# Patient Record
Sex: Female | Born: 1995 | Hispanic: No | Marital: Single
Health system: Midwestern US, Community
[De-identification: ages and names within clinical notes are randomized; demographics above are authoritative.]

## PROBLEM LIST (undated history)

## (undated) ENCOUNTER — Inpatient Hospital Stay (HOSPITAL_COMMUNITY): Payer: Self-pay

## (undated) DIAGNOSIS — J45909 Unspecified asthma, uncomplicated: Secondary | ICD-10-CM

## (undated) DIAGNOSIS — R55 Syncope and collapse: Secondary | ICD-10-CM

## (undated) DIAGNOSIS — T7840XA Allergy, unspecified, initial encounter: Secondary | ICD-10-CM

## (undated) DIAGNOSIS — J339 Nasal polyp, unspecified: Secondary | ICD-10-CM

## (undated) DIAGNOSIS — J302 Other seasonal allergic rhinitis: Secondary | ICD-10-CM

## (undated) HISTORY — DX: Syncope and collapse: R55

## (undated) HISTORY — DX: Nasal polyp, unspecified: J33.9

## (undated) HISTORY — PX: NASAL POLYP SURGERY: SHX186

## (undated) HISTORY — DX: Allergy, unspecified, initial encounter: T78.40XA

---
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
## 2015-03-11 DIAGNOSIS — J45909 Unspecified asthma, uncomplicated: Secondary | ICD-10-CM | POA: Insufficient documentation

## 2015-09-27 ENCOUNTER — Encounter (INDEPENDENT_AMBULATORY_CARE_PROVIDER_SITE_OTHER): Payer: Self-pay

## 2015-09-27 ENCOUNTER — Ambulatory Visit (INDEPENDENT_AMBULATORY_CARE_PROVIDER_SITE_OTHER): Payer: BLUE CROSS/BLUE SHIELD | Admitting: Medical

## 2015-09-27 ENCOUNTER — Encounter: Payer: Self-pay | Admitting: Medical

## 2015-09-27 VITALS — BP 118/78 | HR 65 | Temp 98.1°F | Ht 69.5 in | Wt 163.0 lb

## 2015-09-27 DIAGNOSIS — J4520 Mild intermittent asthma, uncomplicated: Secondary | ICD-10-CM | POA: Diagnosis not present

## 2015-09-27 DIAGNOSIS — N926 Irregular menstruation, unspecified: Secondary | ICD-10-CM

## 2015-09-27 DIAGNOSIS — R55 Syncope and collapse: Secondary | ICD-10-CM

## 2015-09-27 DIAGNOSIS — J309 Allergic rhinitis, unspecified: Secondary | ICD-10-CM | POA: Diagnosis not present

## 2015-09-27 DIAGNOSIS — N912 Amenorrhea, unspecified: Secondary | ICD-10-CM

## 2015-09-27 LAB — CBC WITH DIFFERENTIAL/PLATELET
BASOS PCT: 0.7 % (ref 0.0–3.0)
Basophils Absolute: 0 10*3/uL (ref 0.0–0.1)
EOS PCT: 9.2 % — AB (ref 0.0–5.0)
Eosinophils Absolute: 0.4 10*3/uL (ref 0.0–0.7)
HCT: 39.5 % (ref 36.0–49.0)
HEMOGLOBIN: 13 g/dL (ref 12.0–16.0)
Lymphocytes Relative: 51.9 % — ABNORMAL HIGH (ref 24.0–48.0)
Lymphs Abs: 2.2 10*3/uL (ref 0.7–4.0)
MCHC: 32.9 g/dL (ref 31.0–37.0)
MCV: 89.5 fl (ref 78.0–98.0)
MONOS PCT: 7 % (ref 3.0–12.0)
Monocytes Absolute: 0.3 10*3/uL (ref 0.1–1.0)
Neutro Abs: 1.3 10*3/uL — ABNORMAL LOW (ref 1.4–7.7)
Neutrophils Relative %: 31.2 % — ABNORMAL LOW (ref 43.0–71.0)
Platelets: 279 10*3/uL (ref 150.0–575.0)
RBC: 4.41 Mil/uL (ref 3.80–5.70)
RDW: 13.5 % (ref 11.4–15.5)
WBC: 4.2 10*3/uL — AB (ref 4.5–13.5)

## 2015-09-27 LAB — COMPREHENSIVE METABOLIC PANEL
ALT: 8 U/L (ref 0–35)
AST: 12 U/L (ref 0–37)
Albumin: 4.4 g/dL (ref 3.5–5.2)
Alkaline Phosphatase: 50 U/L (ref 47–119)
BUN: 11 mg/dL (ref 6–23)
CALCIUM: 9.6 mg/dL (ref 8.4–10.5)
CHLORIDE: 107 meq/L (ref 96–112)
CO2: 28 mEq/L (ref 19–32)
Creatinine, Ser: 0.71 mg/dL (ref 0.40–1.20)
GFR: 135.54 mL/min (ref >60.00)
Glucose, Bld: 83 mg/dL (ref 70–99)
POTASSIUM: 4 meq/L (ref 3.5–5.1)
Sodium: 140 mEq/L (ref 135–145)
TOTAL PROTEIN: 7.5 g/dL (ref 6.0–8.3)
Total Bilirubin: 0.6 mg/dL (ref 0.2–1.2)

## 2015-09-27 LAB — POCT URINALYSIS DIPSTICK
Bilirubin, UA: NEGATIVE
Blood, UA: NEGATIVE
Glucose, UA: NEGATIVE
KETONES UA: NEGATIVE
LEUKOCYTES UA: NEGATIVE
Nitrite, UA: NEGATIVE
Spec Grav, UA: 1.025
UROBILINOGEN UA: 0.2
pH, UA: 6

## 2015-09-27 LAB — POCT URINE PREGNANCY: Preg Test, Ur: NEGATIVE

## 2015-09-27 MED ORDER — BECLOMETHASONE DIPROPIONATE 40 MCG/ACT IN AERS: 2.0000 | INHALATION_SPRAY | Freq: Two times a day (BID) | RESPIRATORY_TRACT | Status: DC

## 2015-09-27 MED ORDER — ALBUTEROL SULFATE HFA 108 (90 BASE) MCG/ACT IN AERS: 2.0000 | INHALATION_SPRAY | Freq: Four times a day (QID) | RESPIRATORY_TRACT | Status: DC | PRN

## 2015-09-27 NOTE — Progress Notes (Signed)
Pre visit review using our clinic review tool, if applicable. No additional management support is needed unless otherwise documented below in the visit note. 

## 2015-09-27 NOTE — Patient Instructions (Signed)
For syncope we did ekg today. Also got cbc and cmp today.  Cause of syncope undetermined but will refer to cardiologist and neurologist since this is second event.  If syncope occurs again you would need ED evaluation at that time to see what is going on at that time.  For asthma will rx qvar inhaler to use daily. Use albuterol as needed per instruction.  Continue allergy meds.  Until seen by specialist recommend trying not drive or operate heavy machinery.  Follow up in 7 days or as needed

## 2015-09-27 NOTE — Progress Notes (Signed)
Subjective:    Patient ID: Stacey Wyatt, female    DOB: 02-Feb-1996, 19 y.o.   MRN: [id number]  HPI   I have reviewed pt PMH, PSH, FH, Social History and Surgical History.  Pt not working now. Monday last day at [employer], no regular exercise, poor diet, eats a lot of sugary foods, single.   Asthma- pt states she has hx of problems. She thinks chronically. But she describes some wheezing one- two times a week and with activity. Sometimes with just brisk walk. Pt states years one severe excacerbation after exposed to smoke when she was 12. Went to ED and treated but not admitted. Pt uses albuterol about twice a week.   Pt does allergies. Summer is worst season but almost year round. Not bad in winter. Pt is on claritin and singulair.  Pt states she passed out at work on Monday. Pt was walking to restroom. Pt states she started to feel dizzy and hot randomly. Pt states she was out for a couple of minutes. This was witnessed. Pt had no shaking or seizure type acitivity reported. Pt felt mild ha afterwards. But no headache before. No chest pain or palptiations before fall. No recent preceding illness. No incontinence at time of syncope. Pt first responder team speculated she had low bs but no fs done and pt states she was eating some things about 10 minutes before this started.   Other time in 2014 she passed out randomly. Pt states back then ems came out and normal blood sugar.  LMP- Sunday first day. Almost finished up per pt. Pt stats she had normal cycle and cycle came when she thought it should.  Miigraine light sensitive and sound sensitive about one time a week. Takes advil and then goes away. This pattern for 2 months. Other day after syncope was not migraine like.            Review of Systems  Constitutional: Negative for fever, chills, diaphoresis, activity change and fatigue.  HENT: Negative for congestion, postnasal drip, sinus pressure and sore throat.     Respiratory: Negative for cough, chest tightness and shortness of breath.   Cardiovascular: Negative for chest pain, palpitations and leg swelling.  Gastrointestinal: Negative for nausea, vomiting and abdominal pain.  Genitourinary: Negative for frequency, flank pain and difficulty urinating.  Musculoskeletal: Negative for neck pain and neck stiffness.  Neurological: Positive for headaches. Negative for dizziness, tremors, seizures, syncope, facial asymmetry, speech difficulty, weakness, light-headedness and numbness.       See hpi dizzy before syncope.   No ha now. Transient  Faint mild after syncope.   Psychiatric/Behavioral: Negative for behavioral problems, confusion and agitation. The patient is not nervous/anxious.     Past Medical History  Diagnosis Date  . Asthma   . Seasonal allergies   . Allergy     Social History   Social History  . Marital Status: Single    Spouse Name: N/A  . Number of Children: N/A  . Years of Education: N/A   Occupational History  . Not on file.   Social History Main Topics  . Smoking status: Never Smoker   . Smokeless tobacco: Not on file  . Alcohol Use: No  . Drug Use: Yes    Special: Marijuana     Comment: states smokes blunt very rare. every other week.  Marland Kitchen. Sexual Activity: Yes    Birth Control/ Protection: None   Other Topics Concern  . Not on file  Social History Narrative    Past Surgical History  Procedure Laterality Date  . Nasal polyp surgery Bilateral     Family History  Problem Relation Age of Onset  . Hypertension Mother   . Allergic rhinitis Father     No Known Allergies  No current outpatient prescriptions on file prior to visit.   No current facility-administered medications on file prior to visit.    BP 118/78 mmHg  Pulse 65  Temp(Src) 98.1 F (36.7 C) (Oral)  Ht 5' 9.5" (1.765 m)  Wt 163 lb (73.936 kg)  BMI 23.73 kg/m2  SpO2 99%       Objective:   Physical Exam  General Mental Status-  Alert. General Appearance- Not in acute distress.   Skin General: Color- Normal Color. Moisture- Normal Moisture.  Neck Carotid Arteries- Normal color. Moisture- Normal Moisture. No carotid bruits. No JVD.  Chest and Lung Exam Auscultation: Breath Sounds:-Normal.  Cardiovascular Auscultation:Rythm- Regular. Murmurs & Other Heart Sounds:Auscultation of the heart reveals- No Murmurs.  Abdomen Inspection:-Inspeection Normal. Palpation/Percussion:Note:No mass. Palpation and Percussion of the abdomen reveal- Non Tender, Non Distended + BS, no rebound or guarding.    Neurologic Cranial Nerve exam:- CN III-XII intact(No nystagmus), symmetric smile. Drift Test:- No drift. .Finger to Nose:- Normal/Intact Strength:- 5/5 equal and symmetric strength both upper and lower extremities.      Assessment & Plan:  For syncope we did ekg today. Also got cbc and cmp today. On ekg leads v leads may indicate rt incomplete bundle branch block.  Cause of syncope undetermined but will refer to cardiologist and neurologist since this is second event.  If syncope occurs again you would need ED evaluation at that time to see what is going on at that time.  For asthma will rx qvar inhaler to use daily. Use albuterol as needed per instruction.  Continue allergy meds.  Until seen by specialist recommend trying not drive or operate heavy machinery.  Follow up in 7 days or as needed

## 2015-10-01 ENCOUNTER — Telehealth: Payer: Self-pay | Admitting: Medical

## 2015-10-01 NOTE — Telephone Encounter (Signed)
Referral have been placed. Advise pt that Victorino DikeJennifer or Ollen GrossMarjorie should eventually call her with those.

## 2015-10-02 NOTE — Telephone Encounter (Signed)
Called patient,states she has been called about both appointments.

## 2015-10-04 ENCOUNTER — Encounter: Payer: Self-pay | Admitting: Medical

## 2015-10-04 ENCOUNTER — Ambulatory Visit (INDEPENDENT_AMBULATORY_CARE_PROVIDER_SITE_OTHER): Payer: BLUE CROSS/BLUE SHIELD | Admitting: Medical

## 2015-10-04 ENCOUNTER — Telehealth: Payer: Self-pay | Admitting: Medical

## 2015-10-04 VITALS — BP 104/70 | HR 61 | Temp 97.1°F | Ht 69.5 in | Wt 160.6 lb

## 2015-10-04 DIAGNOSIS — J4520 Mild intermittent asthma, uncomplicated: Secondary | ICD-10-CM

## 2015-10-04 DIAGNOSIS — R55 Syncope and collapse: Secondary | ICD-10-CM

## 2015-10-04 MED ORDER — BECLOMETHASONE DIPROPIONATE 40 MCG/ACT IN AERS: 2.0000 | INHALATION_SPRAY | Freq: Two times a day (BID) | RESPIRATORY_TRACT | Status: DC

## 2015-10-04 NOTE — Progress Notes (Signed)
Subjective:    Patient ID: Stacey Wyatt, female    DOB: 1995/11/20, 19 y.o.   MRN: [id number]  HPI   Pt in for follow up. She states she feels good today.  Pt states she has not used qvar. Her pharmacy did not have qvar. They told her needed to get from other store. She is still using albuterol twice a day. No wheezing currently.  Pt states no recurrent syncope. But she states the night that she was seen by me when she was cooking that she felt hot and light headed. She thought maybe might pass out again. She sat down and rested 15-20 minutes. Then symptoms subsided. She thinks her heart was beating little fast at that time.  LMP- 2 wks ago.    Review of Systems  Constitutional: Negative for chills and fatigue.  HENT: Negative for congestion, drooling, ear pain, hearing loss, postnasal drip, rhinorrhea, sinus pressure and sneezing.   Respiratory: Negative for cough, chest tightness, shortness of breath and wheezing.   Musculoskeletal: Negative for back pain.  Skin: Negative for pallor.  Neurological: Negative for dizziness, seizures, syncope, weakness, light-headedness, numbness and headaches.  Hematological: Negative for adenopathy. Does not bruise/bleed easily.  Psychiatric/Behavioral: Negative for suicidal ideas, confusion, dysphoric mood and agitation. The patient is not hyperactive.     Past Medical History  Diagnosis Date  . Asthma   . Seasonal allergies   . Allergy     Social History   Social History  . Marital Status: Single    Spouse Name: N/A  . Number of Children: N/A  . Years of Education: N/A   Occupational History  . Not on file.   Social History Main Topics  . Smoking status: Never Smoker   . Smokeless tobacco: Not on file  . Alcohol Use: No  . Drug Use: Yes    Special: Marijuana     Comment: states smokes blunt very rare. every other week.  Marland Kitchen Sexual Activity: Yes    Birth Control/ Protection: None   Other Topics Concern  . Not on file    Social History Narrative    Past Surgical History  Procedure Laterality Date  . Nasal polyp surgery Bilateral     Family History  Problem Relation Age of Onset  . Hypertension Mother   . Allergic rhinitis Father     No Known Allergies  Current Outpatient Prescriptions on File Prior to Visit  Medication Sig Dispense Refill  . albuterol (PROVENTIL HFA;VENTOLIN HFA) 108 (90 BASE) MCG/ACT inhaler Inhale 2 puffs into the lungs every 6 (six) hours as needed for wheezing or shortness of breath. 1 Inhaler 0  . beclomethasone (QVAR) 40 MCG/ACT inhaler Inhale 2 puffs into the lungs 2 (two) times daily. 1 Inhaler 12  . Multiple Vitamins-Minerals (MULTIVITAMIN WITH MINERALS) tablet Take 1 tablet by mouth daily.    . norethindrone-ethinyl estradiol-iron (ESTROSTEP FE,TILIA FE,TRI-LEGEST FE) 1-20/1-30/1-35 MG-MCG tablet Take 1 tablet by mouth daily.     No current facility-administered medications on file prior to visit.    BP 104/70 mmHg  Pulse 61  Temp(Src) 97.1 F (36.2 C) (Oral)  Ht 5' 9.5" (1.765 m)  Wt 160 lb 9.6 oz (72.848 kg)  BMI 23.38 kg/m2  SpO2 100%       Objective:   Physical Exam  General Mental Status- Alert. General Appearance- Not in acute distress.   Skin General: Color- Normal Color. Moisture- Normal Moisture.  Neck Carotid Arteries- Normal color. Moisture- Normal Moisture. No carotid  bruits. No JVD.  Chest and Lung Exam Auscultation: Breath Sounds:-Normal.  Cardiovascular Auscultation:Rythm- Regular. Murmurs & Other Heart Sounds:Auscultation of the heart reveals- No Murmurs.  Abdomen Inspection:-Inspeection Normal. Palpation/Percussion:Note:No mass. Palpation and Percussion of the abdomen reveal- Non Tender, Non Distended + BS, no rebound or guarding.    Neurologic Cranial Nerve exam:- CN III-XII intact(No nystagmus), symmetric smile. Strength:- 5/5 equal and symmetric strength both upper and lower extremities.      Assessment & Plan:   For your asthma, I will send qvar downstairs to our pharmacy. Please use daily so we can assess your response. Continue albuterol if you need as instructed.  For your hx of syncope attend both neurology and cardiology appointment.(next week)  Please remember for any actual syncope then be evaluated same day at ED to evaluate potential cause.  Follow up with me in 2-3 wks.

## 2015-10-04 NOTE — Progress Notes (Signed)
Pre visit review using our clinic review tool, if applicable. No additional management support is needed unless otherwise documented below in the visit note. 

## 2015-10-04 NOTE — Patient Instructions (Signed)
For your asthma, I will send qvar downstairs to our pharmacy. Please use daily so we can assess your response. Continue albuterol if you need as instructed.  For your hx of syncope attend both neurology and cardiology appointment.  Please remember for any actual syncope then be evaluated same day at ED to evaluate potential cause.  Follow up with me in 2-3 wks.

## 2015-10-04 NOTE — Telephone Encounter (Signed)
Pt had near syncope episode 1 wk ago day I saw her. Can cardiology eval be expidited??

## 2015-10-04 NOTE — Telephone Encounter (Signed)
Pt later told me that she will see cardiologist on jan 4th or 6th. So I think that is reasonable. Disregard expidite request.

## 2015-10-08 DIAGNOSIS — T7840XA Allergy, unspecified, initial encounter: Secondary | ICD-10-CM | POA: Insufficient documentation

## 2015-10-08 DIAGNOSIS — J302 Other seasonal allergic rhinitis: Secondary | ICD-10-CM | POA: Insufficient documentation

## 2015-10-08 DIAGNOSIS — R55 Syncope and collapse: Secondary | ICD-10-CM | POA: Insufficient documentation

## 2015-10-08 NOTE — Progress Notes (Signed)
     HPI: 20 yo female for evaluation of syncope. Labs 12/16 showed hgb 13 and normal K. Patient had her first episode of syncope in August 2015 in [place]. She was working at [employer] and was taking an order when she developed sudden feeling of warmth and mild diaphoresis. No chest pain, dyspnea, nausea, palpitations. She then had a syncopal episode. She had another episode approximately one month ago. It is described in similar ways. Approximately one week ago she had an episode of near-syncope while at home again preceded by feelings of hot, mild diaphoresis and no other symptoms. However she does state she was dizzy for approximately 30 minutes. After each episode she has a migraine. She does have dyspnea on exertion which she attributes to asthma. No orthopnea, PND, pedal edema, exertional chest pain or palpitations. She does not have orthostatic symptoms.  Current Outpatient Prescriptions  Medication Sig Dispense Refill  . albuterol (PROVENTIL HFA;VENTOLIN HFA) 108 (90 BASE) MCG/ACT inhaler Inhale 2 puffs into the lungs every 6 (six) hours as needed for wheezing or shortness of breath. 1 Inhaler 0  . beclomethasone (QVAR) 40 MCG/ACT inhaler Inhale 2 puffs into the lungs 2 (two) times daily. 1 Inhaler 3  . Multiple Vitamins-Minerals (MULTIVITAMIN WITH MINERALS) tablet Take 1 tablet by mouth daily.    . norethindrone-ethinyl estradiol-iron (ESTROSTEP FE,TILIA FE,TRI-LEGEST FE) 1-20/1-30/1-35 MG-MCG tablet Take 1 tablet by mouth daily.     No current facility-administered medications for this visit.    No Known Allergies   Past Medical History  Diagnosis Date  . Asthma   . Seasonal allergies   . Allergy   . Syncope     Past Surgical History  Procedure Laterality Date  . Nasal polyp surgery Bilateral     Social History   Social History  . Marital Status: Single    Spouse Name: N/A  . Number of Children: N/A  . Years of Education: N/A   Occupational History  . Not on file.    Social History Main Topics  . Smoking status: Never Smoker   . Smokeless tobacco: Not on file  . Alcohol Use: No  . Drug Use: Yes    Special: Marijuana     Comment: states smokes blunt very rare. every other week.  Marland Kitchen. Sexual Activity: Yes    Birth Control/ Protection: None   Other Topics Concern  . Not on file   Social History Narrative    Family History  Problem Relation Age of Onset  . Hypertension Mother   . Allergic rhinitis Father     ROS: no fevers or chills, productive cough, hemoptysis, dysphasia, odynophagia, melena, hematochezia, dysuria, hematuria, rash, seizure activity, orthopnea, PND, pedal edema, claudication. Remaining systems are negative.  Physical Exam:   Blood pressure 120/72, pulse 70, height 5\' 9"  (1.753 m), weight 162 lb (73.483 kg).  General:  Well developed/well nourished in NAD Skin warm/dry Patient not depressed No peripheral clubbing Back-normal HEENT-normal/normal eyelids Neck supple/normal carotid upstroke bilaterally; no bruits; no JVD; no thyromegaly chest - CTA/ normal expansion CV - RRR/normal S1 and S2; no murmurs, rubs or gallops;  PMI nondisplaced Abdomen -NT/ND, no HSM, no mass, + bowel sounds, no bruit 2+ femoral pulses, no bruits Ext-no edema, chords, 2+ DP Neuro-grossly nonfocal  ECG 09/27/15-NSR, incomplete RBBB  Today's electrocardiogram shows normal sinus rhythm with no ST changes. Normal QT interval.

## 2015-10-09 ENCOUNTER — Encounter: Payer: Self-pay | Admitting: Cardiology

## 2015-10-09 ENCOUNTER — Ambulatory Visit (INDEPENDENT_AMBULATORY_CARE_PROVIDER_SITE_OTHER): Payer: BLUE CROSS/BLUE SHIELD | Admitting: Cardiology

## 2015-10-09 VITALS — BP 120/72 | HR 70 | Ht 69.0 in | Wt 162.0 lb

## 2015-10-09 DIAGNOSIS — R55 Syncope and collapse: Secondary | ICD-10-CM | POA: Diagnosis not present

## 2015-10-09 NOTE — Assessment & Plan Note (Signed)
Continue present medications. 

## 2015-10-09 NOTE — Assessment & Plan Note (Signed)
Etiology unclear but sounds potentially vagally mediated. However she does have migraines afterwards. She is scheduled to see neurology for further evaluation. Schedule echocardiogram to assess LV function. No electrocardiogram is normal. I instructed her to increase her hydration and sodium intake. Hopefully these measures will improve her symptoms. I will see her back in 3 months to reassess. I did explain that she should not drive for 6 months.

## 2015-10-09 NOTE — Patient Instructions (Signed)
Medication Instructions:   NO CHANGE  Testing/Procedures:  Your physician has requested that you have an echocardiogram. Echocardiography is a painless test that uses sound waves to create images of your heart. It provides your doctor with information about the size and shape of your heart and how well your heart's chambers and valves are working. This procedure takes approximately one hour. There are no restrictions for this procedure.    Follow-Up:  Your physician recommends that you schedule a follow-up appointment in: 3 MONTHS WITH DR CRENSHAW   If you need a refill on your cardiac medications before your next appointment, please call your pharmacy.    

## 2015-10-11 ENCOUNTER — Ambulatory Visit (INDEPENDENT_AMBULATORY_CARE_PROVIDER_SITE_OTHER): Payer: BLUE CROSS/BLUE SHIELD | Admitting: Neurology

## 2015-10-11 ENCOUNTER — Encounter: Payer: Self-pay | Admitting: Neurology

## 2015-10-11 VITALS — BP 118/84 | HR 78 | Ht 69.0 in | Wt 160.0 lb

## 2015-10-11 DIAGNOSIS — R55 Syncope and collapse: Secondary | ICD-10-CM

## 2015-10-11 NOTE — Patient Instructions (Signed)
1. Schedule MRI brain with and without contrast 2. Schedule routine EEG 3. As per [place] driving laws, after an episode of loss of consciousness, no driving until 6 months event-free 4. Follow-up in 1 month, call for any changes

## 2015-10-11 NOTE — Progress Notes (Signed)
NEUROLOGY CONSULTATION NOTE  Stacey Wyatt MRN: [id number] DOB: May 18, 1996  Referring provider: Esperanza Richters, PA-C Primary care provider: Esperanza Richters, PA-C  Reason for consult:  Recurrent syncope  Thank you for your kind referral of Stacey Wyatt for consultation of the above symptoms. Although her history is well known to you, please allow me to reiterate it for the purpose of our medical record. Records and images were personally reviewed where available.  HISTORY OF PRESENT ILLNESS: This is a pleasant 20 year old left-handed woman with a history of asthma presenting for 2 episodes of syncope and an episode of near-syncope. She reports the first episode occurred in August 2015 while living in [place], she was working at [employer] when she started feeling hot, her surroundings started spinning and she felt somewhat confused, then she could not keep her balance and fell, losing consciousness for around 5 minutes. No convulsive activity reported. She woke up with a bad headache with frontal pressure, no associated nausea, vomiting, photo/phonophobia. The second episode occurred on 09/23/15, while she was at work, she again had the same hot sensation ("like a hot chill"), diaphoretic, and again with the room spinning. She tried to walk to the restroom but passed out for a few minutes. She again woke up with a bad headache and feeling shaky. The last episode occurred on 09/27/15, she did not pass out but felt the same symptoms with spinning, she sat down and the symptoms went away after 30 minutes. She reports that she was unsure if the first episode was related to PO intake, but she did recall eating prior to the second episode. She denies any sleep deprivation or alcohol use.   She denies any other headaches aside from those that occur after the events (no headaches prior to the episodes). She denies any previous history of migraines, no family history of migraines or similar symptoms. She  denies any olfactory/gustatory hallucinations, deja vu, rising epigastric sensation, focal numbness/tingling/weakness, myoclonic jerks. No diplopia, dysarthria, dysphagia, neck pain, bowel/bladder dysfunction. She has some back pain from her mattress. She denies any staring/unresponsive episodes or gaps in time except for when she passes out. She would feel her heart beating fast. She had a normal birth and early development.  There is no history of febrile convulsions, CNS infections such as meningitis/encephalitis, significant traumatic brain injury, neurosurgical procedures, or family history of seizures.  Laboratory Data:  Lab Results  Component Value Date   WBC 4.2* 09/27/2015   HGB 13.0 09/27/2015   HCT 39.5 09/27/2015   MCV 89.5 09/27/2015   PLT 279.0 09/27/2015     Chemistry      Component Value Date/Time   NA 140 09/27/2015 1122   K 4.0 09/27/2015 1122   CL 107 09/27/2015 1122   CO2 28 09/27/2015 1122   BUN 11 09/27/2015 1122   CREATININE 0.71 09/27/2015 1122      Component Value Date/Time   CALCIUM 9.6 09/27/2015 1122   ALKPHOS 50 09/27/2015 1122   AST 12 09/27/2015 1122   ALT 8 09/27/2015 1122   BILITOT 0.6 09/27/2015 1122      PAST MEDICAL HISTORY: Past Medical History  Diagnosis Date  . Asthma   . Seasonal allergies   . Allergy   . Syncope     PAST SURGICAL HISTORY: Past Surgical History  Procedure Laterality Date  . Nasal polyp surgery Bilateral     MEDICATIONS: Current Outpatient Prescriptions on File Prior to Visit  Medication Sig Dispense Refill  .  albuterol (PROVENTIL HFA;VENTOLIN HFA) 108 (90 BASE) MCG/ACT inhaler Inhale 2 puffs into the lungs every 6 (six) hours as needed for wheezing or shortness of breath. 1 Inhaler 0  . beclomethasone (QVAR) 40 MCG/ACT inhaler Inhale 2 puffs into the lungs 2 (two) times daily. 1 Inhaler 3  . Multiple Vitamins-Minerals (MULTIVITAMIN WITH MINERALS) tablet Take 1 tablet by mouth daily.    . norethindrone-ethinyl  estradiol-iron (ESTROSTEP FE,TILIA FE,TRI-LEGEST FE) 1-20/1-30/1-35 MG-MCG tablet Take 1 tablet by mouth daily.     No current facility-administered medications on file prior to visit.    ALLERGIES: No Known Allergies  FAMILY HISTORY: Family History  Problem Relation Age of Onset  . Hypertension Mother   . Allergic rhinitis Father     SOCIAL HISTORY: Social History   Social History  . Marital Status: Single    Spouse Name: N/A  . Number of Children: N/A  . Years of Education: N/A   Occupational History  . Not on file.   Social History Main Topics  . Smoking status: Never Smoker   . Smokeless tobacco: Not on file  . Alcohol Use: No  . Drug Use: No  . Sexual Activity: Yes    Birth Control/ Protection: None   Other Topics Concern  . Not on file   Social History Narrative    REVIEW OF SYSTEMS: Constitutional: No fevers, chills, or sweats, no generalized fatigue, change in appetite Eyes: No visual changes, double vision, eye pain Ear, nose and throat: No hearing loss, ear pain, nasal congestion, sore throat Cardiovascular: No chest pain, palpitations Respiratory:  No shortness of breath at rest or with exertion, wheezes GastrointestinaI: No nausea, vomiting, diarrhea, abdominal pain, fecal incontinence Genitourinary:  No dysuria, urinary retention or frequency Musculoskeletal:  No neck pain, occasional back pain Integumentary: No rash, pruritus, skin lesions Neurological: as above Psychiatric: No depression, insomnia, anxiety Endocrine: No palpitations, fatigue, diaphoresis, mood swings, change in appetite, change in weight, increased thirst Hematologic/Lymphatic:  No anemia, purpura, petechiae. Allergic/Immunologic: no itchy/runny eyes, nasal congestion, recent allergic reactions, rashes  PHYSICAL EXAM: Filed Vitals:   10/11/15 1247  BP: 118/84  Pulse: 78   General: No acute distress Head:  Normocephalic/atraumatic Eyes: Fundoscopic exam shows bilateral  sharp discs, no vessel changes, exudates, or hemorrhages Neck: supple, no paraspinal tenderness, full range of motion Back: No paraspinal tenderness Heart: regular rate and rhythm Lungs: Clear to auscultation bilaterally. Vascular: No carotid bruits. Skin/Extremities: No rash, no edema Neurological Exam: Mental status: alert and oriented to person, place, and time, no dysarthria or aphasia, Fund of knowledge is appropriate.  Recent and remote memory are intact.  Attention and concentration are normal.    Able to name objects and repeat phrases. Cranial nerves: CN I: not tested CN II: pupils equal, round and reactive to light, visual fields intact, fundi unremarkable. CN III, IV, VI:  full range of motion, no nystagmus, no ptosis CN V: facial sensation intact CN VII: upper and lower face symmetric CN VIII: hearing intact to finger rub CN IX, X: gag intact, uvula midline CN XI: sternocleidomastoid and trapezius muscles intact CN XII: tongue midline Bulk & Tone: normal, no fasciculations. Motor: 5/5 throughout with no pronator drift. Sensation: intact to light touch, cold, pin, vibration and joint position sense.  No extinction to double simultaneous stimulation.  Romberg test negative Deep Tendon Reflexes: +2 throughout, no ankle clonus Plantar responses: downgoing bilaterally Cerebellar: no incoordination on finger to nose, heel to shin. No dysdiadochokinesia Gait: narrow-based and steady,  able to tandem walk adequately. Tremor: none  IMPRESSION: This is a pleasant 20 year old left-handed woman with a history of asthma presenting for 2 episodes of syncope, most recently last 09/20/15. She also reports a near-syncopal episode last month. Symptoms suggestive of vasovagal syncope, agree with cardiac evaluation. From a neurological standpoint, the headaches appear to be a response to the passing out rather than true migraines. MRI brain with and without contrast will be ordered to assess for  underlying structural abnormality. Seizures are considered, but less likely, routine EEG will be done.  driving laws were discussed with the patient, and she knows to stop driving after an episode of loss of consciousness, until 6 months event-free. She will follow-up in 1 month and knows to call for any changes.    Thank you for allowing me to participate in the care of this patient. Please do not hesitate to call for any questions or concerns.   Patrcia Dolly, M.D.  CC: Esperanza Richters, Dr. Jens Som

## 2015-10-16 ENCOUNTER — Ambulatory Visit (HOSPITAL_BASED_OUTPATIENT_CLINIC_OR_DEPARTMENT_OTHER)
Admission: RE | Admit: 2015-10-16 | Discharge: 2015-10-16 | Disposition: A | Discharge status: Home / Self Care | Payer: BLUE CROSS/BLUE SHIELD | Source: Ambulatory Visit | Attending: Cardiology | Admitting: Cardiology

## 2015-10-16 DIAGNOSIS — R55 Syncope and collapse: Secondary | ICD-10-CM | POA: Diagnosis not present

## 2015-10-16 NOTE — Addendum Note (Signed)
Addended by: Evans LanceSTOVER, Kiyoshi Schaab W on: 10/16/2015 02:44 PM   Modules accepted: Orders

## 2015-10-16 NOTE — Progress Notes (Signed)
  Echocardiogram 2D Echocardiogram has been performed.  Stacey Wyatt 10/16/2015, 2:05 PM

## 2015-10-25 ENCOUNTER — Encounter: Payer: BLUE CROSS/BLUE SHIELD | Admitting: Medical

## 2015-10-25 ENCOUNTER — Telehealth: Payer: Self-pay | Admitting: Medical

## 2015-10-25 NOTE — Progress Notes (Signed)
This encounter was created in error - please disregard.

## 2015-10-26 ENCOUNTER — Ambulatory Visit (HOSPITAL_BASED_OUTPATIENT_CLINIC_OR_DEPARTMENT_OTHER): Payer: BLUE CROSS/BLUE SHIELD

## 2015-10-28 ENCOUNTER — Other Ambulatory Visit: Payer: BLUE CROSS/BLUE SHIELD

## 2015-10-28 NOTE — Telephone Encounter (Signed)
No charge. 

## 2015-10-28 NOTE — Telephone Encounter (Signed)
Pt was no show 10/25/15 9:15am for follow up appt, pt has not rescheduled, 1st no show, charge or no charge?

## 2015-11-13 ENCOUNTER — Ambulatory Visit: Payer: BLUE CROSS/BLUE SHIELD | Admitting: Neurology

## 2016-04-15 ENCOUNTER — Emergency Department (HOSPITAL_COMMUNITY)
Admission: EM | Admit: 2016-04-15 | Discharge: 2016-04-15 | Disposition: A | Discharge status: Home / Self Care | Payer: BLUE CROSS/BLUE SHIELD | Attending: Emergency Medicine | Admitting: Emergency Medicine

## 2016-04-15 ENCOUNTER — Encounter (HOSPITAL_COMMUNITY): Payer: Self-pay

## 2016-04-15 DIAGNOSIS — Y999 Unspecified external cause status: Secondary | ICD-10-CM | POA: Diagnosis not present

## 2016-04-15 DIAGNOSIS — J45909 Unspecified asthma, uncomplicated: Secondary | ICD-10-CM | POA: Insufficient documentation

## 2016-04-15 DIAGNOSIS — R51 Headache: Secondary | ICD-10-CM | POA: Insufficient documentation

## 2016-04-15 DIAGNOSIS — Y92410 Unspecified street and highway as the place of occurrence of the external cause: Secondary | ICD-10-CM | POA: Insufficient documentation

## 2016-04-15 DIAGNOSIS — R6884 Jaw pain: Secondary | ICD-10-CM

## 2016-04-15 DIAGNOSIS — R519 Headache, unspecified: Secondary | ICD-10-CM

## 2016-04-15 DIAGNOSIS — Y939 Activity, unspecified: Secondary | ICD-10-CM | POA: Insufficient documentation

## 2016-04-15 MED ORDER — OXYCODONE-ACETAMINOPHEN 5-325 MG PO TABS: 1.0000 | ORAL_TABLET | Freq: Four times a day (QID) | ORAL | Status: DC | PRN

## 2016-04-15 MED ORDER — NAPROXEN 500 MG PO TABS: 500.0000 mg | ORAL_TABLET | Freq: Two times a day (BID) | ORAL | Status: DC

## 2016-04-15 MED ORDER — METHOCARBAMOL 500 MG PO TABS: 500.0000 mg | ORAL_TABLET | Freq: Two times a day (BID) | ORAL | Status: DC

## 2016-04-15 MED ORDER — NAPROXEN 250 MG PO TABS
500.0000 mg | ORAL_TABLET | Freq: Once | ORAL | Status: AC
  Administered 2016-04-15: 500 mg via ORAL
  Filled 2016-04-15: qty 2
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
## 2016-11-04 ENCOUNTER — Inpatient Hospital Stay (HOSPITAL_COMMUNITY)
Admission: AD | Admit: 2016-11-04 | Discharge: 2016-11-05 | Disposition: A | Discharge status: Home / Self Care | Payer: Medicaid Other | Source: Ambulatory Visit | Attending: Obstetrics and Gynecology | Admitting: Obstetrics and Gynecology

## 2016-11-04 ENCOUNTER — Encounter (HOSPITAL_COMMUNITY): Payer: Self-pay | Admitting: *Deleted

## 2016-11-04 DIAGNOSIS — Z3202 Encounter for pregnancy test, result negative: Secondary | ICD-10-CM | POA: Insufficient documentation

## 2016-11-04 DIAGNOSIS — J09X2 Influenza due to identified novel influenza A virus with other respiratory manifestations: Secondary | ICD-10-CM | POA: Diagnosis not present

## 2016-11-04 DIAGNOSIS — J101 Influenza due to other identified influenza virus with other respiratory manifestations: Secondary | ICD-10-CM | POA: Diagnosis not present

## 2016-11-04 DIAGNOSIS — J45909 Unspecified asthma, uncomplicated: Secondary | ICD-10-CM | POA: Diagnosis not present

## 2016-11-04 DIAGNOSIS — J4520 Mild intermittent asthma, uncomplicated: Secondary | ICD-10-CM | POA: Diagnosis not present

## 2016-11-04 DIAGNOSIS — R05 Cough: Secondary | ICD-10-CM | POA: Diagnosis present

## 2016-11-04 LAB — INFLUENZA PANEL BY PCR (TYPE A & B)
Influenza A By PCR: POSITIVE — AB
Influenza B By PCR: NEGATIVE

## 2016-11-04 LAB — URINALYSIS, ROUTINE W REFLEX MICROSCOPIC
Bilirubin Urine: NEGATIVE
Glucose, UA: NEGATIVE mg/dL
HGB URINE DIPSTICK: NEGATIVE
Ketones, ur: 5 mg/dL — AB
Leukocytes, UA: NEGATIVE
NITRITE: NEGATIVE
PH: 6 (ref 5.0–8.0)
Protein, ur: NEGATIVE mg/dL
SPECIFIC GRAVITY, URINE: 1.026 (ref 1.005–1.030)

## 2016-11-04 LAB — POCT PREGNANCY, URINE: Preg Test, Ur: NEGATIVE

## 2016-11-04 NOTE — MAU Note (Signed)
Pt reports cough, body aches, chills, headache.

## 2016-11-04 NOTE — MAU Provider Note (Signed)
Chief Complaint: No chief complaint on file.   First Provider Initiated Contact with Patient 11/04/16 2352      SUBJECTIVE HPI: Stacey Wyatt is a 21 y.o. female who presents [facility] reporting chills, body aches, congestion, cough, HA x ~2 days. Thought it was from mold in her house. No sick contacts. Did not get flu vaccine.   Location: Full body Quality: sore, achy Severity: 10/10 on pain scale Duration: few days Timing: constant Modifying factors: Improved w/ Nyquil Associated signs and symptoms: Pos for chills, cough, congestion.  Past Medical History:  Diagnosis Date  . Allergy   . Asthma   . Seasonal allergies   . Syncope    OB History  No data available   Past Surgical History:  Procedure Laterality Date  . NASAL POLYP SURGERY Bilateral    Social History   Social History  . Marital status: Single    Spouse name: N/A  . Number of children: N/A  . Years of education: N/A   Occupational History  . Not on file.   Social History Main Topics  . Smoking status: Never Smoker  . Smokeless tobacco: Not on file  . Alcohol use No  . Drug use: No  . Sexual activity: Yes    Birth control/ protection: None   Other Topics Concern  . Not on file   Social History Narrative  . No narrative on file   Family History  Problem Relation Age of Onset  . Hypertension Mother   . Allergic rhinitis Father    No current facility-administered medications on file prior to encounter.    Current Outpatient Prescriptions on File Prior to Encounter  Medication Sig Dispense Refill  . albuterol (PROVENTIL HFA;VENTOLIN HFA) 108 (90 BASE) MCG/ACT inhaler Inhale 2 puffs into the lungs every 6 (six) hours as needed for wheezing or shortness of breath. 1 Inhaler 0  . beclomethasone (QVAR) 40 MCG/ACT inhaler Inhale 2 puffs into the lungs 2 (two) times daily. 1 Inhaler 3  . methocarbamol (ROBAXIN) 500 MG tablet Take 1 tablet (500 mg total) by mouth 2 (two) times daily.  20 tablet 0  . Multiple Vitamins-Minerals (MULTIVITAMIN WITH MINERALS) tablet Take 1 tablet by mouth daily.    . naproxen (NAPROSYN) 500 MG tablet Take 1 tablet (500 mg total) by mouth 2 (two) times daily. 30 tablet 0  . norethindrone-ethinyl estradiol-iron (ESTROSTEP FE,TILIA FE,TRI-LEGEST FE) 1-20/1-30/1-35 MG-MCG tablet Take 1 tablet by mouth daily.    Marland Kitchen. oxyCODONE-acetaminophen (PERCOCET/ROXICET) 5-325 MG tablet Take 1 tablet by mouth every 6 (six) hours as needed for severe pain. 6 tablet 0   No Known Allergies  I have reviewed patient's Past Medical Hx, Surgical Hx, Family Hx, Social Hx, medications and allergies.   Review of Systems  Constitutional: Positive for chills and fatigue. Negative for appetite change.       Didn't check temp  HENT: Positive for congestion and rhinorrhea. Negative for sinus pressure and sore throat.   Eyes: Negative for visual disturbance.  Respiratory: Positive for cough. Negative for shortness of breath.   Gastrointestinal: Negative for abdominal pain, diarrhea, nausea and vomiting.  Musculoskeletal: Positive for myalgias. Negative for neck pain and neck stiffness.  Neurological: Positive for headaches.    OBJECTIVE Patient Vitals for the past 24 hrs:  BP Temp Temp src Pulse Resp Height Weight  11/04/16 2205 133/89 99.1 F (37.3 C) Oral 96 20 5\' 10"  (1.778 m) 161 lb (73 kg)   Constitutional: Well-developed, well-nourished female in  mild distress. Ill-appearing.  Cardiovascular: normal rate Respiratory: normal rate and effort. CTAB.  Neurologic: Alert and oriented x 4.  GU: Deferred  LAB RESULTS Results for orders placed or performed during the hospital encounter of 11/04/16 (from the past 24 hour(s))  Urinalysis, Routine w reflex microscopic     Status: Abnormal   Collection Time: 11/04/16 10:10 PM  Result Value Ref Range   Color, Urine YELLOW YELLOW   APPearance HAZY (A) CLEAR   Specific Gravity, Urine 1.026 1.005 - 1.030   pH 6.0 5.0 - 8.0    Glucose, UA NEGATIVE NEGATIVE mg/dL   Hgb urine dipstick NEGATIVE NEGATIVE   Bilirubin Urine NEGATIVE NEGATIVE   Ketones, ur 5 (A) NEGATIVE mg/dL   Protein, ur NEGATIVE NEGATIVE mg/dL   Nitrite NEGATIVE NEGATIVE   Leukocytes, UA NEGATIVE NEGATIVE  Influenza panel by PCR (type A & B)     Status: Abnormal   Collection Time: 11/04/16 10:15 PM  Result Value Ref Range   Influenza A By PCR POSITIVE (A) NEGATIVE   Influenza B By PCR NEGATIVE NEGATIVE  Pregnancy, urine POC     Status: None   Collection Time: 11/04/16 10:20 PM  Result Value Ref Range   Preg Test, Ur NEGATIVE NEGATIVE    IMAGING No results found.  MAU COURSE Orders Placed This Encounter  Procedures  . Influenza panel by PCR (type A & B)  . Urinalysis, Routine w reflex microscopic  . Droplet precaution  . Pregnancy, urine POC    MDM - Influenza A w/oout evidence of complication. Pt has Dx Asthma. Will refill Albuterol and Tx w/ Tamiflu due to increased risk of complications from Flu.   ASSESSMENT 1. Influenza A    PLAN Discharge home in stable condition. Influenza precautions Increase fluids and rest.  Work/school note given Dicussed measures to prevent spread of Flu.   Ibuprofen/Telenol PRN for Body aches. OTC meds for Sx relief.  Follow-up Information    [facility] Follow up.   Why:  for primary care Contact information: [address] [place] [address] [phone]       [facility] EMERGENCY DEPARTMENT Follow up.   Specialty:  Emergency Medicine Why:  as needed if symptoms worsen Contact information: [address] [id number] [place] [address] [phone]          Allergies as of 11/05/2016   No Known Allergies     Medication List    STOP taking these medications   beclomethasone 40 MCG/ACT inhaler Commonly known as:  QVAR   methocarbamol 500 MG tablet Commonly known as:  ROBAXIN   multivitamin with  minerals tablet   naproxen 500 MG tablet Commonly known as:  NAPROSYN   norethindrone-ethinyl estradiol-iron 1-20/1-30/1-35 MG-MCG tablet Commonly known as:  ESTROSTEP FE,TILIA FE,TRI-LEGEST FE   oxyCODONE-acetaminophen 5-325 MG tablet Commonly known as:  PERCOCET/ROXICET     TAKE these medications   albuterol 108 (90 Base) MCG/ACT inhaler Commonly known as:  PROVENTIL HFA;VENTOLIN HFA Inhale 2 puffs into the lungs every 6 (six) hours as needed for wheezing or shortness of breath.   oseltamivir 75 MG capsule Commonly known as:  TAMIFLU Take 1 capsule (75 mg total) by mouth 2 (two) times daily.       Farmer, CNM 11/04/2016  11:50 PM

## 2016-11-05 ENCOUNTER — Encounter (HOSPITAL_COMMUNITY): Payer: Self-pay | Admitting: Advanced Practice Midwife

## 2016-11-05 DIAGNOSIS — J101 Influenza due to other identified influenza virus with other respiratory manifestations: Secondary | ICD-10-CM | POA: Diagnosis not present

## 2016-11-05 DIAGNOSIS — J4520 Mild intermittent asthma, uncomplicated: Secondary | ICD-10-CM | POA: Diagnosis not present

## 2016-11-05 MED ORDER — ALBUTEROL SULFATE HFA 108 (90 BASE) MCG/ACT IN AERS: 2.0000 | INHALATION_SPRAY | Freq: Four times a day (QID) | RESPIRATORY_TRACT | 0 refills | Status: DC | PRN

## 2016-11-05 MED ORDER — OSELTAMIVIR PHOSPHATE 75 MG PO CAPS: 75.0000 mg | ORAL_CAPSULE | Freq: Two times a day (BID) | ORAL | 0 refills | Status: DC

## 2016-11-05 NOTE — Discharge Instructions (Signed)

## 2016-11-25 ENCOUNTER — Inpatient Hospital Stay (HOSPITAL_COMMUNITY): Payer: Medicaid Other

## 2016-11-25 ENCOUNTER — Encounter (HOSPITAL_COMMUNITY): Payer: Self-pay

## 2016-11-25 ENCOUNTER — Inpatient Hospital Stay (HOSPITAL_COMMUNITY)
Admission: AD | Admit: 2016-11-25 | Discharge: 2016-11-25 | Disposition: A | Discharge status: Home / Self Care | Payer: Medicaid Other | Source: Ambulatory Visit | Attending: Obstetrics & Gynecology | Admitting: Obstetrics & Gynecology

## 2016-11-25 DIAGNOSIS — Z3A01 Less than 8 weeks gestation of pregnancy: Secondary | ICD-10-CM | POA: Diagnosis not present

## 2016-11-25 DIAGNOSIS — B9689 Other specified bacterial agents as the cause of diseases classified elsewhere: Secondary | ICD-10-CM | POA: Diagnosis not present

## 2016-11-25 DIAGNOSIS — O23591 Infection of other part of genital tract in pregnancy, first trimester: Secondary | ICD-10-CM | POA: Diagnosis not present

## 2016-11-25 DIAGNOSIS — N760 Acute vaginitis: Secondary | ICD-10-CM | POA: Diagnosis not present

## 2016-11-25 DIAGNOSIS — O209 Hemorrhage in early pregnancy, unspecified: Secondary | ICD-10-CM | POA: Diagnosis not present

## 2016-11-25 LAB — HCG, QUANTITATIVE, PREGNANCY: HCG, BETA CHAIN, QUANT, S: 1263 m[IU]/mL — AB (ref <5)

## 2016-11-25 LAB — POCT PREGNANCY, URINE: Preg Test, Ur: POSITIVE — AB

## 2016-11-25 LAB — WET PREP, GENITAL
Sperm: NONE SEEN
Trich, Wet Prep: NONE SEEN
Yeast Wet Prep HPF POC: NONE SEEN

## 2016-11-25 MED ORDER — METRONIDAZOLE 500 MG PO TABS: 500.0000 mg | ORAL_TABLET | Freq: Two times a day (BID) | ORAL | 0 refills | Status: DC

## 2016-11-25 MED ORDER — PREPLUS 27-1 MG PO TABS: 1.0000 | ORAL_TABLET | Freq: Every day | ORAL | 13 refills | Status: DC

## 2016-11-25 NOTE — MAU Provider Note (Signed)
History     CSN: [id number]  Arrival date and time: 11/25/16 1842   None     Chief Complaint  Patient presents with  . Possible Pregnancy  . Vaginal Bleeding   HPI 21 yo G1P0 presenting today for the evaluation of vaginal bleeding. Patient with LMP 10/20/2016. She reports positive pregnancy test x 3 at home. This was an unplanned pregnancy. Patient reports lower abdominal cramping pain for the past few days and noted bright red blood on her underwear this afternoon and when she wipes. She has not had any further episodes of vaginal bleeding. She describes it as vaginal spotting. She denies any abnormal vaginal discharge. She denies any burning with urination   Past Medical History:  Diagnosis Date  . Allergy   . Asthma   . Seasonal allergies   . Syncope     Past Surgical History:  Procedure Laterality Date  . NASAL POLYP SURGERY Bilateral     Family History  Problem Relation Age of Onset  . Hypertension Mother   . Allergic rhinitis Father     Social History  Substance Use Topics  . Smoking status: Never Smoker  . Smokeless tobacco: Never Used  . Alcohol use No    Allergies: No Known Allergies  Prescriptions Prior to Admission  Medication Sig Dispense Refill Last Dose  . albuterol (PROVENTIL HFA;VENTOLIN HFA) 108 (90 Base) MCG/ACT inhaler Inhale 2 puffs into the lungs every 6 (six) hours as needed for wheezing or shortness of breath. 1 Inhaler 0 11/25/2016 at Unknown time  . oseltamivir (TAMIFLU) 75 MG capsule Take 1 capsule (75 mg total) by mouth 2 (two) times daily. (Patient not taking: Reported on 11/25/2016) 10 capsule 0 Not Taking at Unknown time    Review of Systems  See pertinent in HPI Physical Exam   Blood pressure 116/90, pulse 103, temperature 98.5 F (36.9 C), temperature source Oral, resp. rate 18, height 5\' 10"  (1.778 m), weight 151 lb 3.2 oz (68.6 kg), last menstrual period 10/18/2016.  Physical Exam GENERAL: Well-developed, well-nourished  female in no acute distress.  ABDOMEN: Soft, nontender, nondistended.  PELVIC: Normal external female genitalia. Vagina is pink and rugated.  Normal discharge. Dark blood in vault. Normal appearing cervix. No active bleeding noted.  Uterus is normal in size. No adnexal mass or tenderness. Cervix is closed and long EXTREMITIES: No cyanosis, clubbing, or edema, 2+ distal pulses.  MAU Course  Procedures  MDM Microscopic wet-mount exam shows clue cells.  US Ob Comp Less 14 Wks  Result Date: 11/25/2016 CLINICAL DATA:  Vaginal spotting EXAM: OBSTETRIC <14 WK [place] AND TRANSVAGINAL OB US TECHNIQUE: Both transabdominal and transvaginal ultrasound examinations were performed for complete evaluation of the gestation as well as the maternal uterus, adnexal regions, and pelvic cul-de-sac. Transvaginal technique was performed to assess early pregnancy. COMPARISON:  None. FINDINGS: Intrauterine gestational sac: Single intrauterine gestational sac Yolk sac:  Visualized Embryo:  Not visualized MSD: 3.4  mm   5 w   0  d Subchorionic hemorrhage:  None visualized. Maternal uterus/adnexae: Ovaries are within normal limits. Right ovary measures 2.1 x 2.8 x 1.4 cm. Left ovary measures 2.5 by 3.7 x 1.8 cm. No free fluid. IMPRESSION: Single intrauterine gestational sac with small yolk sac present. Fetal pole not yet identified. No other abnormalities are visualized. Electronically Signed   By: Jasmine Pang M.D.   On: 11/25/2016 19:38   US Ob Transvaginal  Result Date: 11/25/2016 CLINICAL DATA:  Vaginal spotting EXAM: OBSTETRIC <14  WK US AND TRANSVAGINAL OB US TECHNIQUE: Both transabdominal and transvaginal ultrasound examinations were performed for complete evaluation of the gestation as well as the maternal uterus, adnexal regions, and pelvic cul-de-sac. Transvaginal technique was performed to assess early pregnancy. COMPARISON:  None. FINDINGS: Intrauterine gestational sac: Single intrauterine gestational sac Yolk sac:   Visualized Embryo:  Not visualized MSD: 3.4  mm   5 w   0  d Subchorionic hemorrhage:  None visualized. Maternal uterus/adnexae: Ovaries are within normal limits. Right ovary measures 2.1 x 2.8 x 1.4 cm. Left ovary measures 2.5 by 3.7 x 1.8 cm. No free fluid. IMPRESSION: Single intrauterine gestational sac with small yolk sac present. Fetal pole not yet identified. No other abnormalities are visualized. Electronically Signed   By: Jasmine PangKim  Fujinaga M.D.   On: 11/25/2016 19:38     Assessment and Plan  21 yo G1P0 at 3358w3d by LMP first trimester vaginal bleeding and BV - Rx Flagyl provided - Ultrasound results reviewed with the patient - Plan for repeat ultrasound in 10-14 days for viability - Precautions reviewed and reasons to return to MAU explained - List of providers given to start prenatal care - Patient advised to start prenatal vitamins  Stacey Wyatt 11/25/2016, 7:43 PM

## 2016-11-25 NOTE — MAU Note (Signed)
3+ HPTs.  [facility]  Has had some cramping, none now.  Noted spotting about 30min ago, red when wiped.

## 2016-11-26 ENCOUNTER — Telehealth: Payer: Self-pay | Admitting: *Deleted

## 2016-11-26 DIAGNOSIS — O3680X0 Pregnancy with inconclusive fetal viability, not applicable or unspecified: Secondary | ICD-10-CM

## 2016-11-26 LAB — GC/CHLAMYDIA PROBE AMP (~~LOC~~) NOT AT ARMC
Chlamydia: NEGATIVE
Neisseria Gonorrhea: NEGATIVE

## 2016-11-26 NOTE — Telephone Encounter (Signed)
Called pt and informed her that the doctor wants her to have follow up US to check the progress of the pregnancy. I have scheduled the US on 3/5 @ 0900. She will receive the results in our office following the exam.  Pt agreed to plan of care and voiced understanding.

## 2016-12-03 ENCOUNTER — Other Ambulatory Visit: Payer: Self-pay | Admitting: *Deleted

## 2016-12-03 DIAGNOSIS — Z3490 Encounter for supervision of normal pregnancy, unspecified, unspecified trimester: Secondary | ICD-10-CM

## 2016-12-07 ENCOUNTER — Ambulatory Visit (HOSPITAL_COMMUNITY)
Admission: RE | Admit: 2016-12-07 | Discharge: 2016-12-07 | Disposition: A | Discharge status: Home / Self Care | Payer: Medicaid Other | Source: Ambulatory Visit | Attending: Obstetrics and Gynecology | Admitting: Obstetrics and Gynecology

## 2016-12-07 ENCOUNTER — Ambulatory Visit: Payer: Self-pay

## 2016-12-07 DIAGNOSIS — Z3A01 Less than 8 weeks gestation of pregnancy: Secondary | ICD-10-CM | POA: Insufficient documentation

## 2016-12-07 DIAGNOSIS — Z3490 Encounter for supervision of normal pregnancy, unspecified, unspecified trimester: Secondary | ICD-10-CM

## 2016-12-07 DIAGNOSIS — Z3689 Encounter for other specified antenatal screening: Secondary | ICD-10-CM | POA: Insufficient documentation

## 2016-12-07 DIAGNOSIS — O3680X0 Pregnancy with inconclusive fetal viability, not applicable or unspecified: Secondary | ICD-10-CM

## 2016-12-07 NOTE — Progress Notes (Signed)
Pt here today for OB US results.  Notified Dorathy KinsmanVirginia Smith, CNM of results.  Provider recommended that pt have f/u US for viability due to decreased FHR.   US scheduled for March 19th @ 1000.  Pt notified.  Pt stated understanding with no further questions.

## 2016-12-14 ENCOUNTER — Other Ambulatory Visit: Payer: Self-pay | Admitting: General Practice

## 2016-12-14 DIAGNOSIS — Z3490 Encounter for supervision of normal pregnancy, unspecified, unspecified trimester: Secondary | ICD-10-CM

## 2016-12-21 ENCOUNTER — Ambulatory Visit (INDEPENDENT_AMBULATORY_CARE_PROVIDER_SITE_OTHER): Payer: Medicaid Other | Admitting: Family Medicine

## 2016-12-21 ENCOUNTER — Telehealth: Payer: Self-pay | Admitting: Family Medicine

## 2016-12-21 ENCOUNTER — Ambulatory Visit (HOSPITAL_COMMUNITY)
Admission: RE | Admit: 2016-12-21 | Discharge: 2016-12-21 | Disposition: A | Discharge status: Home / Self Care | Payer: Medicaid Other | Source: Ambulatory Visit | Attending: Advanced Practice Midwife | Admitting: Advanced Practice Midwife

## 2016-12-21 ENCOUNTER — Telehealth: Payer: Self-pay | Admitting: *Deleted

## 2016-12-21 ENCOUNTER — Encounter: Payer: Self-pay | Admitting: *Deleted

## 2016-12-21 ENCOUNTER — Encounter: Payer: Self-pay | Admitting: Family Medicine

## 2016-12-21 DIAGNOSIS — O021 Missed abortion: Secondary | ICD-10-CM

## 2016-12-21 DIAGNOSIS — Z3490 Encounter for supervision of normal pregnancy, unspecified, unspecified trimester: Secondary | ICD-10-CM | POA: Diagnosis present

## 2016-12-21 DIAGNOSIS — O468X1 Other antepartum hemorrhage, first trimester: Secondary | ICD-10-CM | POA: Insufficient documentation

## 2016-12-21 DIAGNOSIS — Z3A01 Less than 8 weeks gestation of pregnancy: Secondary | ICD-10-CM | POA: Diagnosis not present

## 2016-12-21 MED ORDER — IBUPROFEN 600 MG PO TABS: 600.0000 mg | ORAL_TABLET | Freq: Four times a day (QID) | ORAL | 1 refills | Status: DC | PRN

## 2016-12-21 MED ORDER — MISOPROSTOL 200 MCG PO TABS: 1000.0000 ug | ORAL_TABLET | Freq: Once | ORAL | 1 refills | Status: DC

## 2016-12-21 NOTE — Telephone Encounter (Signed)
Attempted to reach patient to come in for type and screen today. Unable to reach her. Sent a Clinical cytogeneticistmychart message for patient to come in for labs.

## 2016-12-21 NOTE — Patient Instructions (Addendum)

## 2016-12-21 NOTE — Progress Notes (Signed)
Ultrasounds Results Note  SUBJECTIVE HPI:  Ms. Stacey Wyatt is a 21 y.o. G1P0 at [redacted]w[redacted]d by LMP who presents to the [facility] for followup ultrasound results. The patient denies abdominal pain or vaginal bleeding.  Upon review of the patient's records, patient was first seen in MAU on 11/25/16 for bleeding.  Ultrasound showed yolk sac.   Repeat ultrasound was performed on 12/07/16 and showed fetal bradycardia U/S performed earlier today shows fetal demise.Marland Kitchen   Past Medical History:  Diagnosis Date  . Allergy   . Asthma   . Seasonal allergies   . Syncope    Past Surgical History:  Procedure Laterality Date  . NASAL POLYP SURGERY Bilateral    Social History   Social History  . Marital status: Single    Spouse name: N/A  . Number of children: N/A  . Years of education: N/A   Occupational History  . Not on file.   Social History Main Topics  . Smoking status: Never Smoker  . Smokeless tobacco: Never Used  . Alcohol use No  . Drug use: No  . Sexual activity: Yes    Birth control/ protection: None   Other Topics Concern  . Not on file   Social History Narrative  . No narrative on file   Current Outpatient Prescriptions on File Prior to Visit  Medication Sig Dispense Refill  . albuterol (PROVENTIL HFA;VENTOLIN HFA) 108 (90 Base) MCG/ACT inhaler Inhale 2 puffs into the lungs every 6 (six) hours as needed for wheezing or shortness of breath. 1 Inhaler 0  . metroNIDAZOLE (FLAGYL) 500 MG tablet Take 1 tablet (500 mg total) by mouth 2 (two) times daily. 14 tablet 0  . Prenatal Vit-Fe Fumarate-FA (PREPLUS) 27-1 MG TABS Take 1 tablet by mouth daily. 30 tablet 13   No current facility-administered medications on file prior to visit.    No Known Allergies  I have reviewed patient's Past Medical Hx, Surgical Hx, Family Hx, Social Hx, medications and allergies.   Review of Systems Review of Systems  Constitutional: Negative for fever and chills.  Gastrointestinal:  Negative for nausea, vomiting, abdominal pain, diarrhea and constipation.  Genitourinary: Negative for dysuria.  Musculoskeletal: Negative for back pain.  Neurological: Negative for dizziness and weakness.    Physical Exam  LMP 10/18/2016   GENERAL: Well-developed, well-nourished female in no acute distress.  HEENT: Normocephalic, atraumatic.   LUNGS: Effort normal ABDOMEN: soft, non-tender HEART: Regular rate  SKIN: Warm, dry and without erythema PSYCH: Normal mood and affect NEURO: Alert and oriented x 4  IMAGING US Ob Comp Less 14 Wks  Result Date: 11/25/2016 CLINICAL DATA:  Vaginal spotting EXAM: OBSTETRIC <14 WK [place] AND TRANSVAGINAL OB US TECHNIQUE: Both transabdominal and transvaginal ultrasound examinations were performed for complete evaluation of the gestation as well as the maternal uterus, adnexal regions, and pelvic cul-de-sac. Transvaginal technique was performed to assess early pregnancy. COMPARISON:  None. FINDINGS: Intrauterine gestational sac: Single intrauterine gestational sac Yolk sac:  Visualized Embryo:  Not visualized MSD: 3.4  mm   5 w   0  d Subchorionic hemorrhage:  None visualized. Maternal uterus/adnexae: Ovaries are within normal limits. Right ovary measures 2.1 x 2.8 x 1.4 cm. Left ovary measures 2.5 by 3.7 x 1.8 cm. No free fluid. IMPRESSION: Single intrauterine gestational sac with small yolk sac present. Fetal pole not yet identified. No other abnormalities are visualized. Electronically Signed   By: Jasmine Pang M.D.   On: 11/25/2016 19:38   US  Ob Transvaginal  Result Date: 12/21/2016 CLINICAL DATA:  Early stage pregnancy, followup, assess viability and dating EXAM: TRANSVAGINAL OB ULTRASOUND TECHNIQUE: Transvaginal ultrasound was performed for complete evaluation of the gestation as well as the maternal uterus, adnexal regions, and pelvic cul-de-sac. COMPARISON:  12/07/2016 FINDINGS: Intrauterine gestational sac: Present Yolk sac:  Present Embryo:  Present  Cardiac Activity: Absent Heart Rate: N/A bpm MSD: 21.1  mm   7 w   0 d CRL:   2.9  mm   5 w 5 d Subchorionic hemorrhage: Small subchorionic hemorrhage, 3.2 x 1.2 x 1.5 cm Maternal uterus/adnexae: Uterus otherwise unremarkable. Ovaries normal appearance with a probably corpus luteum in LEFT ovary. No adnexal masses. IMPRESSION: Intrauterine gestational sac containing a fetal pole and yolk sac. Fetal cardiac activity however is no longer identified consistent with fetal demise. Electronically Signed   By: Ulyses Southward M.D.   On: 12/21/2016 10:59   US Ob Transvaginal  Result Date: 12/07/2016 CLINICAL DATA:  Early stage pregnancy EXAM: TRANSVAGINAL OB ULTRASOUND TECHNIQUE: Transvaginal ultrasound was performed for complete evaluation of the gestation as well as the maternal uterus, adnexal regions, and pelvic cul-de-sac. COMPARISON:  None. FINDINGS: Intrauterine gestational sac: Single Yolk sac:  Visualized Embryo:  Visualized Cardiac Activity: Visualized Heart Rate: 95 bpm MSD:   mm    w     d CRL:   4  mm   6 w 0 d                  [place] EDC: 08/02/2017 Subchorionic hemorrhage:  None visualized. Maternal uterus/adnexae: No adnexal masses or free fluid IMPRESSION: Six week intrauterine pregnancy. Fetal heart rate 95 beats per minute. This is lower than expected, but could be related to bradycardia of early pregnancy. This could be followed with repeat ultrasound in 2 weeks to ensure expected progression. Electronically Signed   By: Charlett Nose M.D.   On: 12/07/2016 10:38   US Ob Transvaginal  Result Date: 11/25/2016 CLINICAL DATA:  Vaginal spotting EXAM: OBSTETRIC <14 WK [place] AND TRANSVAGINAL OB US TECHNIQUE: Both transabdominal and transvaginal ultrasound examinations were performed for complete evaluation of the gestation as well as the maternal uterus, adnexal regions, and pelvic cul-de-sac. Transvaginal technique was performed to assess early pregnancy. COMPARISON:  None. FINDINGS: Intrauterine gestational sac:  Single intrauterine gestational sac Yolk sac:  Visualized Embryo:  Not visualized MSD: 3.4  mm   5 w   0  d Subchorionic hemorrhage:  None visualized. Maternal uterus/adnexae: Ovaries are within normal limits. Right ovary measures 2.1 x 2.8 x 1.4 cm. Left ovary measures 2.5 by 3.7 x 1.8 cm. No free fluid. IMPRESSION: Single intrauterine gestational sac with small yolk sac present. Fetal pole not yet identified. No other abnormalities are visualized. Electronically Signed   By: Jasmine Pang M.D.   On: 11/25/2016 19:38    ASSESSMENT Missed AB PLAN Discharge home in stable condition Discussed management of missed abortion: expectant management vs misoprostol vs D&E.  Risks and benefits of all modalities discussed; all questions answered.  Patient opted for misoprostol administration.  Risks and benefits of medical management were carefully explained, including a success rate of 80-90%, the need for another person to be at home with her, and to call/come in if she had heavy bleeding, dizziness, or severe pain not relieved by medication.  Verbal consent was obtained.  Misoprostol 1000 mcg, and Ibuprofen  were all prescribed; written instructions given to patient. She will follow up in one week;  if there has been no passage of pregnancy, will consider D&E.  Bleeding precautions reviewed; she was told to call clinic or come to MAU for any concerns.  Additionally, there is no evidence of type and screen for this patient, will have her come back in for this and given Rhogam if needed.    Reva Boresanya S Collier Monica, MD  12/21/2016  11:35 AM

## 2016-12-21 NOTE — Telephone Encounter (Signed)
Dr.Pratt ask me to call to see if the patient can come back in to give some lab work for her Blood Type, I called the patient three times and no answer and also unable to leave a voice message.

## 2016-12-23 NOTE — Telephone Encounter (Signed)
Called pt and phone rang several times, then disconnected - unable to leave message. Pt needs to come in for ABO+RH and antibody screen labs due to missed Ab.

## 2016-12-24 NOTE — Telephone Encounter (Signed)
Called patient, no answer- phone rings multiple times then disconnects. mychart message has already been sent.

## 2017-01-11 ENCOUNTER — Ambulatory Visit (INDEPENDENT_AMBULATORY_CARE_PROVIDER_SITE_OTHER): Payer: Medicaid Other | Admitting: Family Medicine

## 2017-01-11 ENCOUNTER — Encounter: Payer: Self-pay | Admitting: Family Medicine

## 2017-01-11 VITALS — BP 118/83 | HR 66 | Wt 160.0 lb

## 2017-01-11 DIAGNOSIS — O039 Complete or unspecified spontaneous abortion without complication: Secondary | ICD-10-CM

## 2017-01-11 NOTE — Progress Notes (Signed)
   Subjective:    Patient ID: Stacey Wyatt is a 21 y.o. female presenting with No chief complaint on file.  on 01/11/2017  HPI: Here s/p MAB and treated with cytotec on 12/21/16. Still with small amount of spotting, but wonders if her cycle is coming on. Denies heavy bleeding and denies fever, chills.  Review of Systems  Constitutional: Negative for chills and fever.  Respiratory: Negative for shortness of breath.   Cardiovascular: Negative for chest pain.  Gastrointestinal: Negative for abdominal pain, nausea and vomiting.  Genitourinary: Negative for dysuria.  Skin: Negative for rash.      Objective:    BP 118/83   Pulse 66   Wt 160 lb (72.6 kg)   LMP 10/18/2016   Breastfeeding? Unknown   BMI 22.96 kg/m  Physical Exam  Constitutional: She is oriented to person, place, and time. She appears well-developed and well-nourished. No distress.  HENT:  Head: Normocephalic and atraumatic.  Eyes: No scleral icterus.  Neck: Neck supple.  Cardiovascular: Normal rate.   Pulmonary/Chest: Effort normal.  Abdominal: Soft.  Neurological: She is alert and oriented to person, place, and time.  Skin: Skin is warm and dry.  Psychiatric: She has a normal mood and affect.        Assessment & Plan:  SAB (spontaneous abortion) - complete--warning signs reviewed--declines contraception at this time--will use condoms. - Plan: ABO AND RH , Antibody screen, Antibody screen   Total face-to-face time with patient: 10 minutes. Over 50% of encounter was spent on counseling and coordination of care. Return if symptoms worsen or fail to improve.  Reva Bores 01/11/2017 10:39 AM

## 2017-01-11 NOTE — Patient Instructions (Signed)

## 2017-01-12 LAB — ABO AND RH: Rh Factor: POSITIVE

## 2017-01-12 LAB — ANTIBODY SCREEN: Antibody Screen: NEGATIVE

## 2017-02-18 ENCOUNTER — Inpatient Hospital Stay (HOSPITAL_COMMUNITY)
Admission: AD | Admit: 2017-02-18 | Discharge: 2017-02-18 | Disposition: A | Discharge status: Home / Self Care | Payer: Medicaid Other | Source: Ambulatory Visit | Attending: Obstetrics & Gynecology | Admitting: Obstetrics & Gynecology

## 2017-02-18 ENCOUNTER — Encounter (HOSPITAL_COMMUNITY): Payer: Self-pay

## 2017-02-18 ENCOUNTER — Telehealth: Payer: Self-pay | Admitting: Advanced Practice Midwife

## 2017-02-18 DIAGNOSIS — Z8759 Personal history of other complications of pregnancy, childbirth and the puerperium: Secondary | ICD-10-CM

## 2017-02-18 DIAGNOSIS — Z79899 Other long term (current) drug therapy: Secondary | ICD-10-CM | POA: Insufficient documentation

## 2017-02-18 DIAGNOSIS — R110 Nausea: Secondary | ICD-10-CM | POA: Insufficient documentation

## 2017-02-18 DIAGNOSIS — R109 Unspecified abdominal pain: Secondary | ICD-10-CM

## 2017-02-18 DIAGNOSIS — O039 Complete or unspecified spontaneous abortion without complication: Secondary | ICD-10-CM | POA: Insufficient documentation

## 2017-02-18 LAB — URINALYSIS, ROUTINE W REFLEX MICROSCOPIC
BILIRUBIN URINE: NEGATIVE
GLUCOSE, UA: NEGATIVE mg/dL
KETONES UR: NEGATIVE mg/dL
LEUKOCYTES UA: NEGATIVE
NITRITE: NEGATIVE
PH: 6 (ref 5.0–8.0)
Protein, ur: 30 mg/dL — AB
Specific Gravity, Urine: 1.019 (ref 1.005–1.030)

## 2017-02-18 LAB — CBC
HEMATOCRIT: 38.3 % (ref 36.0–46.0)
Hemoglobin: 13.5 g/dL (ref 12.0–15.0)
MCH: 30.7 pg (ref 26.0–34.0)
MCHC: 35.2 g/dL (ref 30.0–36.0)
MCV: 87 fL (ref 78.0–100.0)
Platelets: 286 10*3/uL (ref 150–400)
RBC: 4.4 MIL/uL (ref 3.87–5.11)
RDW: 12.9 % (ref 11.5–15.5)
WBC: 4.7 10*3/uL (ref 4.0–10.5)

## 2017-02-18 LAB — HCG, QUANTITATIVE, PREGNANCY: hCG, Beta Chain, Quant, S: 4 m[IU]/mL (ref <5)

## 2017-02-18 LAB — POCT PREGNANCY, URINE: Preg Test, Ur: NEGATIVE

## 2017-02-18 MED ORDER — PROMETHAZINE HCL 25 MG PO TABS: 12.5000 mg | ORAL_TABLET | Freq: Four times a day (QID) | ORAL | 0 refills | Status: DC | PRN

## 2017-02-18 NOTE — MAU Note (Signed)
Pt had miscarriage last month. Went to to f/u appointment and never had a f/u BHCG or u/s done a t the last appointment. Pt started having some abd pain and went to the health department and was told she had bacteria "backing up" and [facility] sure her [facility] was completed.

## 2017-02-18 NOTE — Discharge Instructions (Signed)
Abdominal Pain, Adult Abdominal pain can be caused by many things. Often, abdominal pain is not serious and it gets better with no treatment or by being treated at home. However, sometimes abdominal pain is serious. Your health care provider will do a medical history and a physical exam to try to determine the cause of your abdominal pain. Follow these instructions at home:  Take over-the-counter and prescription medicines only as told by your health care provider. Do not take a laxative unless told by your health care provider.  Drink enough fluid to keep your urine clear or pale yellow.  Watch your condition for any changes.  Keep all follow-up visits as told by your health care provider. This is important. Contact a health care provider if:  Your abdominal pain changes or gets worse.  You are not hungry or you lose weight without trying.  You are constipated or have diarrhea for more than 2-3 days.  You have pain when you urinate or have a bowel movement.  Your abdominal pain wakes you up at night.  Your pain gets worse with meals, after eating, or with certain foods.  You are throwing up and cannot keep anything down.  You have a fever. Get help right away if:  Your pain does not go away as soon as your health care provider told you to expect.  You cannot stop throwing up.  Your pain is only in areas of the abdomen, such as the right side or the left lower portion of the abdomen.  You have bloody or black stools, or stools that look like tar.  You have severe pain, cramping, or bloating in your abdomen.  You have signs of dehydration, such as:  Dark urine, very little urine, or no urine.  Cracked lips.  Dry mouth.  Sunken eyes.  Sleepiness.  Weakness. This information is not intended to replace advice given to you by your health care provider. Make sure you discuss any questions you have with your health care provider. Document Released: 07/01/2005 Document  Revised: 04/10/2016 Document Reviewed: 03/04/2016 Elsevier Interactive Patient Education  2017 [employer]vier Inc.  Nausea, Adult Nausea is the feeling of an upset stomach or having to vomit. Nausea on its own is not usually a serious concern, but it may be an early sign of a more serious medical problem. As nausea gets worse, it can lead to vomiting. If vomiting develops, or if you are not able to drink enough fluids, you are at risk of becoming dehydrated. Dehydration can make you tired and thirsty, cause you to have a dry mouth, and decrease how often you urinate. Older adults and people with other diseases or a weak immune system are at higher risk for dehydration. The main goals of treating your nausea are:  To limit repeated nausea episodes.  To prevent vomiting and dehydration. Follow these instructions at home: Follow instructions from your health care provider about how to care for yourself at home. Eating and drinking  Follow these recommendations as told by your health care provider:  Take an oral rehydration solution (ORS). This is a drink that is sold at pharmacies and retail stores.  Drink clear fluids in small amounts as you are able. Clear fluids include water, ice chips, diluted fruit juice, and low-calorie sports drinks.  Eat bland, easy-to-digest foods in small amounts as you are able. These foods include bananas, applesauce, rice, lean meats, toast, and crackers.  Avoid drinking fluids that contain a lot of sugar or caffeine,  such as energy drinks, sports drinks, and soda.  Avoid alcohol.  Avoid spicy or fatty foods. General instructions   Drink enough fluid to keep your urine clear or pale yellow.  Wash your hands often. If soap and water are not available, use hand sanitizer.  Make sure that all people in your household wash their hands well and often.  Rest at home while you recover.  Take over-the-counter and prescription medicines only as told by your health  care provider.  Breathe slowly and deeply when you feel nauseous.  Watch your condition for any changes.  Keep all follow-up visits as told by your health care provider. This is important. Contact a health care provider if:  You have a headache.  You have new symptoms.  Your nausea gets worse.  You have a fever.  You feel light-headed or dizzy.  You vomit.  You cannot keep fluids down. Get help right away if:  You have pain in your chest, neck, arm, or jaw.  You feel extremely weak or you faint.  You have vomit that is bright red or looks like coffee grounds.  You have bloody or black stools or stools that look like tar.  You have a severe headache, a stiff neck, or both.  You have severe pain, cramping, or bloating in your abdomen.  You have a rash.  You have difficulty breathing or are breathing very quickly.  Your heart is beating very quickly.  Your skin feels cold and clammy.  You feel confused.  You have pain when you urinate.  You have signs of dehydration, such as:  Dark urine, very little, or no urine.  Cracked lips.  Dry mouth.  Sunken eyes.  Sleepiness.  Weakness. These symptoms may represent a serious problem that is an emergency. Do not wait to see if the symptoms will go away. Get medical help right away. Call your local emergency services (911 in the [place] Do not drive yourself to the hospital. This information is not intended to replace advice given to you by your health care provider. Make sure you discuss any questions you have with your health care provider. Document Released: 10/29/2004 Document Revised: 02/24/2016 Document Reviewed: 05/28/2015 Elsevier Interactive Patient Education  2017 [employer].

## 2017-02-18 NOTE — MAU Provider Note (Signed)
Chief Complaint: Abdominal Pain   First Provider Initiated Contact with Patient 02/18/17 1220      SUBJECTIVE HPI: Stacey Wyatt is a 21 y.o. G1P0010 who presents to maternity admissions reporting abdominal cramping and nausea x 1 week. She reports her stomach is growling, not really painful, but her stomach is cramping mildly, like with hunger pains. It is associated with nausea that occurs mostly in the mornings and resolves spontaneously.  She went to the health department with her symptoms and had pelvic exam with STD testing, which was negative but was prescribed Flagyl to treat bacterial vaginosis. [facility] and given Cytotec Rx on 12/21/16 and had follow up visit in office on 01/11/17.  She did not have any labwork drawn so at her health department visit she was told she should follow up in case the miscarriage was not completed.  She denies vaginal bleeding, vaginal itching/burning, urinary symptoms, h/a, dizziness, n/v, or fever/chills.     HPI  Past Medical History:  Diagnosis Date  . Allergy   . Asthma   . Seasonal allergies   . Syncope    Past Surgical History:  Procedure Laterality Date  . NASAL POLYP SURGERY Bilateral    Social History   Social History  . Marital status: Single    Spouse name: N/A  . Number of children: N/A  . Years of education: N/A   Occupational History  . Not on file.   Social History Main Topics  . Smoking status: Never Smoker  . Smokeless tobacco: Never Used  . Alcohol use No  . Drug use: No  . Sexual activity: Yes    Birth control/ protection: None   Other Topics Concern  . Not on file   Social History Narrative  . No narrative on file   No current facility-administered medications on file prior to encounter.    Current Outpatient Prescriptions on File Prior to Encounter  Medication Sig Dispense Refill  . albuterol (PROVENTIL HFA;VENTOLIN HFA) 108 (90 Base) MCG/ACT inhaler Inhale 2 puffs into  the lungs every 6 (six) hours as needed for wheezing or shortness of breath. 1 Inhaler 0  . ibuprofen (ADVIL,MOTRIN) 600 MG tablet Take 1 tablet (600 mg total) by mouth every 6 (six) hours as needed. 30 tablet 1  . Prenatal Vit-Fe Fumarate-FA (PREPLUS) 27-1 MG TABS Take 1 tablet by mouth daily. 30 tablet 13   No Known Allergies  ROS:  Review of Systems  Constitutional: Negative for chills, fatigue and fever.  Respiratory: Negative for shortness of breath.   Cardiovascular: Negative for chest pain.  Gastrointestinal: Positive for abdominal pain and nausea.  Genitourinary: Negative for difficulty urinating, dysuria, flank pain, pelvic pain, vaginal bleeding, vaginal discharge and vaginal pain.  Neurological: Negative for dizziness and headaches.  Psychiatric/Behavioral: Negative.      I have reviewed patient's Past Medical Hx, Surgical Hx, Family Hx, Social Hx, medications and allergies.   Physical Exam   Patient Vitals for the past 24 hrs:  BP Temp Temp src Pulse Resp Height Weight  02/18/17 1258 (!) 137/96 - - 84 16 - -  02/18/17 1130 (!) 139/100 - - 89 - - -  02/18/17 1116 (!) 148/101 98.3 F (36.8 C) Oral 84 18 5\' 11"  (1.803 m) 165 lb (74.8 kg)   Constitutional: Well-developed, well-nourished female in no acute distress.  Cardiovascular: normal rate Respiratory: normal effort GI: Abd soft, non-tender. Pos BS x 4 MS: Extremities nontender, no edema,  normal ROM Neurologic: Alert and oriented x 4.  GU: Neg CVAT.  PELVIC EXAM: Deferred   LAB RESULTS Results for orders placed or performed during the hospital encounter of 02/18/17 (from the past 24 hour(s))  Urinalysis, Routine w reflex microscopic     Status: Abnormal   Collection Time: 02/18/17 11:10 AM  Result Value Ref Range   Color, Urine YELLOW YELLOW   APPearance CLEAR CLEAR   Specific Gravity, Urine 1.019 1.005 - 1.030   pH 6.0 5.0 - 8.0   Glucose, UA NEGATIVE NEGATIVE mg/dL   Hgb urine dipstick MODERATE (A)  NEGATIVE   Bilirubin Urine NEGATIVE NEGATIVE   Ketones, ur NEGATIVE NEGATIVE mg/dL   Protein, ur 30 (A) NEGATIVE mg/dL   Nitrite NEGATIVE NEGATIVE   Leukocytes, UA NEGATIVE NEGATIVE   RBC / HPF 0-5 0 - 5 RBC/hpf   WBC, UA 0-5 0 - 5 WBC/hpf   Bacteria, UA RARE (A) NONE SEEN   Squamous Epithelial / LPF 0-5 (A) NONE SEEN   Mucous PRESENT   Pregnancy, urine POC     Status: None   Collection Time: 02/18/17 11:38 AM  Result Value Ref Range   Preg Test, Ur NEGATIVE NEGATIVE  CBC     Status: None   Collection Time: 02/18/17 12:35 PM  Result Value Ref Range   WBC 4.7 4.0 - 10.5 K/uL   RBC 4.40 3.87 - 5.11 MIL/uL   Hemoglobin 13.5 12.0 - 15.0 g/dL   HCT 40.9 81.1 - 91.4 %   MCV 87.0 78.0 - 100.0 fL   MCH 30.7 26.0 - 34.0 pg   MCHC 35.2 30.0 - 36.0 g/dL   RDW 78.2 95.6 - 21.3 %   Platelets 286 150 - 400 K/uL  hCG, quantitative, pregnancy     Status: None   Collection Time: 02/18/17 12:35 PM  Result Value Ref Range   hCG, Beta Chain, Quant, S 4 <5 mIU/mL    B/Positive/-- (04/09 1046)  IMAGING No results found.  MAU Management/MDM: Ordered labs and reviewed results.  Unlikely complication following SAB because pain is minimal and there is no bleeding.  Pt had normal follow up visit in [facility] office after SAB.  Quant hcg result pending at time of patient discharge today.  No evidence of infection, with pain minimal.  Pelvic exam deferred because results wnl at [facility] recently.  Will treat pt for nausea with Phenergan 12.5-25 mg PO Q 6 hours PRN sent to pharmacy. Will call pt today regarding quant hcg result and discuss F/U.   Pt stable at time of discharge.  Addendum:  Called pt regarding quant hcg result of 4 and left message.  Pt returned call immediately afterwards.  Result discussed with pt.  Miscarriage complete with no evidence of retained products or infection.  No recommended follow up.  Continue Phenergan PRN as prescribed and follow up in office for routine care, in MAU for  emergencies.   ASSESSMENT 1. Nausea   2. Abdominal cramping   3. Hx of one miscarriage     PLAN Discharge home      Allergies as of 02/18/2017   No Known Allergies     Medication List    TAKE these medications   albuterol 108 (90 Base) MCG/ACT inhaler Commonly known as:  PROVENTIL HFA;VENTOLIN HFA Inhale 2 puffs into the lungs every 6 (six) hours as needed for wheezing or shortness of breath.   ibuprofen 600 MG tablet Commonly known as:  ADVIL,MOTRIN Take 1 tablet (  600 mg total) by mouth every 6 (six) hours as needed.   PREPLUS 27-1 MG Tabs Take 1 tablet by mouth daily.   promethazine 25 MG tablet Commonly known as:  PHENERGAN Take 0.5-1 tablets (12.5-25 mg total) by mouth every 6 (six) hours as needed.      Follow-up Information    [facility] Follow up.   Specialty:  Obstetrics and Gynecology Why:  The provider from MAU will call you with lab results today or call [phone].  For routine Gyn care/Pap. Return to MAU as needed for emergencies. Contact information: [address] [place] [address] [phone]          Sharen Counter Certified Nurse-Midwife 02/18/2017  5:52 PM

## 2017-02-18 NOTE — Telephone Encounter (Signed)
Called pt to notify her of negative pregnancy with quant hcg of 4 today.  Left message for pt to return call regarding lab results.

## 2017-04-27 ENCOUNTER — Emergency Department (HOSPITAL_BASED_OUTPATIENT_CLINIC_OR_DEPARTMENT_OTHER)
Admission: EM | Admit: 2017-04-27 | Discharge: 2017-04-27 | Disposition: A | Discharge status: Home / Self Care | Payer: Managed Care, Other (non HMO) | Attending: Emergency Medicine | Admitting: Emergency Medicine

## 2017-04-27 ENCOUNTER — Encounter (HOSPITAL_BASED_OUTPATIENT_CLINIC_OR_DEPARTMENT_OTHER): Payer: Self-pay | Admitting: *Deleted

## 2017-04-27 DIAGNOSIS — R4020 Unspecified coma: Secondary | ICD-10-CM

## 2017-04-27 DIAGNOSIS — G4489 Other headache syndrome: Secondary | ICD-10-CM

## 2017-04-27 DIAGNOSIS — J45909 Unspecified asthma, uncomplicated: Secondary | ICD-10-CM | POA: Insufficient documentation

## 2017-04-27 DIAGNOSIS — R55 Syncope and collapse: Secondary | ICD-10-CM | POA: Insufficient documentation

## 2017-04-27 DIAGNOSIS — R51 Headache: Secondary | ICD-10-CM | POA: Diagnosis not present

## 2017-04-27 LAB — BASIC METABOLIC PANEL WITH GFR
Anion gap: 9 (ref 5–15)
BUN: 10 mg/dL (ref 6–20)
CO2: 27 mmol/L (ref 22–32)
Calcium: 9.2 mg/dL (ref 8.9–10.3)
Chloride: 102 mmol/L (ref 101–111)
Creatinine, Ser: 0.78 mg/dL (ref 0.44–1.00)
GFR calc Af Amer: >60 mL/min
GFR calc non Af Amer: >60 mL/min
Glucose, Bld: 84 mg/dL (ref 65–99)
Potassium: 3.5 mmol/L (ref 3.5–5.1)
Sodium: 138 mmol/L (ref 135–145)

## 2017-04-27 LAB — PREGNANCY, URINE: Preg Test, Ur: NEGATIVE

## 2017-04-27 MED ORDER — KETOROLAC TROMETHAMINE 30 MG/ML IJ SOLN
30.0000 mg | Freq: Once | INTRAMUSCULAR | Status: AC
  Administered 2017-04-27: 30 mg via INTRAMUSCULAR
  Filled 2017-04-27: qty 1

## 2017-04-27 NOTE — Discharge Instructions (Signed)
You were seen today for loss of consciousness and headache. You had a partial workup less than 2 years ago to include an echo and EKG. You need to follow-up closely with her primary physician to have repeat evaluation. You had been scheduled for an MRI and an EEG. This is likely reasonable for further evaluation given her persistent episodes of loss of consciousness. If you develop pain or worsening symptoms she should be reevaluated.

## 2017-04-27 NOTE — ED Provider Notes (Signed)
[facility] Provider Note   CSN: [id number] Arrival date & time: 04/27/17  0350     History   Chief Complaint Chief Complaint  Patient presents with  . Headache    HPI Stacey Wyatt is a 21 y.o. female.  HPI  This is a 21 year old female who presents with loss of consciousness and headache. Patient reports she had an episode of loss of consciousness on Saturday night. This was witnessed by friend. Her friend was concerned that she had a seizure. He reported that her eyes rolled in the back of her head. She did not seek medical treatment. She did have a short prodrome. She does have a history of syncope/loss of consciousness episodes and reports that she normally feels dizziness prior to loss of consciousness. She had a partial workup to include an EKG and an echocardiogram. She had been scheduled for an EEG and MRI by neurology; however, due to insurance issues she was unable to get these tests. She was lost to follow-up. Patient states that since having this episode on Saturday night she has had a frontal headache. She states that she gets frontal headaches after these episodes routinely. This one has been persistent. She took ibuprofen with minimal relief. She reports photophobia. She denies any weakness, numbness, tingling. She denies any vision changes or speech disturbance. Current headache is 5 out of 10. Her mother is concerned as the patient lives by herself and continues to have these episodes. Patient denies any fevers, neck stiffness, worst headache of her life.  Patient's chart reviewed. She saw Dr. Jens Som in January of 2017. At that time her EKG showed no evidence of arrhythmia. Echo was reassuring. No hypertrophy of the ventricular wall. She subsequently saw Dr. Karel Jarvis (neurology) who recommended EEG and MRI with and without contrast. Patient did not obtain these tests. She was lost to follow-up.  Past Medical History:  Diagnosis Date  . Allergy   . Asthma     . Seasonal allergies   . Syncope     Patient Active Problem List   Diagnosis Date Noted  . Faintness 10/11/2015  . Asthma   . Seasonal allergies   . Allergy   . Syncope     Past Surgical History:  Procedure Laterality Date  . NASAL POLYP SURGERY Bilateral     OB History    Gravida Para Term Preterm AB Living   1       1     SAB TAB Ectopic Multiple Live Births   1               Home Medications    Prior to Admission medications   Medication Sig Start Date End Date Taking? Authorizing Provider  albuterol (PROVENTIL HFA;VENTOLIN HFA) 108 (90 Base) MCG/ACT inhaler Inhale 2 puffs into the lungs every 6 (six) hours as needed for wheezing or shortness of breath. 11/05/16   Dorathy Kinsman, CNM    Family History Family History  Problem Relation Age of Onset  . Hypertension Mother   . Allergic rhinitis Father     Social History Social History  Substance Use Topics  . Smoking status: Never Smoker  . Smokeless tobacco: Never Used  . Alcohol use No     Allergies   Patient has no known allergies.   Review of Systems Review of Systems  Constitutional: Negative for fever.  Eyes: Positive for photophobia.  Respiratory: Negative for shortness of breath.   Cardiovascular: Negative for chest pain.  Gastrointestinal: Negative  for abdominal pain, nausea and vomiting.  Neurological: Positive for dizziness and headaches. Negative for speech difficulty and weakness.  All other systems reviewed and are negative.    Physical Exam Updated Vital Signs BP (!) 144/103 (BP Location: Right Arm)   Pulse 89   Temp 98.7 F (37.1 C) (Oral)   Resp (!) 22   LMP 04/19/2017   SpO2 99%   Physical Exam  Constitutional: She is oriented to person, place, and time. She appears well-developed and well-nourished. No distress.  HENT:  Head: Normocephalic and atraumatic.  Eyes: Pupils are equal, round, and reactive to light. EOM are normal.  Neck: Normal range of motion. Neck supple.   Cardiovascular: Normal rate, regular rhythm and normal heart sounds.   No murmur heard. Pulmonary/Chest: Effort normal and breath sounds normal. No respiratory distress. She has no wheezes.  Neurological: She is alert and oriented to person, place, and time.  Cranial nerves II through XII intact, 5 out of 5 strength in all 4 extremities, no dysmetria to finger-nose-finger  Skin: Skin is warm and dry.  Psychiatric: She has a normal mood and affect.  Nursing note and vitals reviewed.    ED Treatments / Results  Labs (all labs ordered are listed, but only abnormal results are displayed) Labs Reviewed  BASIC METABOLIC PANEL  PREGNANCY, URINE    EKG  EKG Interpretation  Date/Time:  Tuesday April 27 2017 04:55:27 EDT Ventricular Rate:  70 PR Interval:    QRS Duration: 91 QT Interval:  413 QTC Calculation: 446 R Axis:   13 Text Interpretation:  Sinus rhythm Confirmed by Ross Marcus ([id number]) on 04/27/2017 5:24:41 AM       Radiology No results found.  Procedures Procedures (including critical care time)  Medications Ordered in ED Medications  ketorolac (TORADOL) 30 MG/ML injection 30 mg (30 mg Intramuscular Given 04/27/17 0437)     Initial Impression / Assessment and Plan / ED Course  I have reviewed the triage vital signs and the nursing notes.  Pertinent labs & imaging results that were available during my care of the patient were reviewed by me and considered in my medical decision making (see chart for details).     Patient presents with headache following an episode of loss of consciousness on Saturday. She is nontoxic on exam. Vital signs reassuring. She is nonfocal on neurologic exam. She has a history of similar episodes in the past and has been evaluated by both cardiology and neurology; however, her neurology testing was not completed. Considerations include and could be, seizure. No evidence of arrhythmia on EKG. No metabolic disturbances on BMP. Patient  improved from her headache with Toradol. Do not feel she needs emergent imaging with CT scan this morning as I feel this is likely very low yield. Mother reports the patient now has her own insurance. I have encouraged her to follow-up closely with her primary physician as well as finish her specialty evaluation by neurology. Patient and her mother stated understanding. I have advised her to avoid driving or operating heavy machinery until further evaluation. Patient and her mother stated understanding.  After history, exam, and medical workup I feel the patient has been appropriately medically screened and is safe for discharge home. Pertinent diagnoses were discussed with the patient. Patient was given return precautions.   Final Clinical Impressions(s) / ED Diagnoses   Final diagnoses:  Other headache syndrome  Loss of consciousness (HCC)    New Prescriptions New Prescriptions   No medications on  file     Shon BatonHorton, [place] Dalporto F, MD 04/27/17 [phone]

## 2017-04-27 NOTE — ED Triage Notes (Signed)
Pt here for HA x 4 days states that she has been having fainting spells x 4 years last time Sat night. Woke with HA Sunday AM. Has taken 1 advil since onset. Alert and oriented ambulatory to room

## 2017-04-29 ENCOUNTER — Ambulatory Visit (INDEPENDENT_AMBULATORY_CARE_PROVIDER_SITE_OTHER): Payer: Managed Care, Other (non HMO) | Admitting: Medical

## 2017-04-29 VITALS — BP 120/86 | HR 76 | Temp 97.6°F | Resp 16 | Ht 69.0 in | Wt 161.2 lb

## 2017-04-29 DIAGNOSIS — J301 Allergic rhinitis due to pollen: Secondary | ICD-10-CM

## 2017-04-29 DIAGNOSIS — J3489 Other specified disorders of nose and nasal sinuses: Secondary | ICD-10-CM | POA: Diagnosis not present

## 2017-04-29 DIAGNOSIS — R55 Syncope and collapse: Secondary | ICD-10-CM | POA: Diagnosis not present

## 2017-04-29 LAB — CBC WITH DIFFERENTIAL/PLATELET
Basophils Absolute: 0 10*3/uL (ref 0.0–0.1)
Basophils Relative: 0.9 % (ref 0.0–3.0)
EOS ABS: 0.3 10*3/uL (ref 0.0–0.7)
Eosinophils Relative: 7.5 % — ABNORMAL HIGH (ref 0.0–5.0)
HCT: 38.9 % (ref 36.0–46.0)
HEMOGLOBIN: 12.9 g/dL (ref 12.0–15.0)
Lymphocytes Relative: 49 % — ABNORMAL HIGH (ref 12.0–46.0)
Lymphs Abs: 2 10*3/uL (ref 0.7–4.0)
MCHC: 33.3 g/dL (ref 30.0–36.0)
MCV: 92 fl (ref 78.0–100.0)
Monocytes Absolute: 0.4 10*3/uL (ref 0.1–1.0)
Monocytes Relative: 9.1 % (ref 3.0–12.0)
Neutro Abs: 1.4 10*3/uL (ref 1.4–7.7)
Neutrophils Relative %: 33.5 % — ABNORMAL LOW (ref 43.0–77.0)
Platelets: 272 10*3/uL (ref 150.0–400.0)
RBC: 4.23 Mil/uL (ref 3.87–5.11)
RDW: 13.4 % (ref 11.5–15.5)
WBC: 4.1 10*3/uL (ref 4.0–10.5)

## 2017-04-29 MED ORDER — FLUTICASONE PROPIONATE 50 MCG/ACT NA SUSP: 2.0000 | Freq: Every day | NASAL | 2 refills | Status: DC

## 2017-04-29 MED ORDER — FLUCONAZOLE 150 MG PO TABS: 150.0000 mg | ORAL_TABLET | Freq: Once | ORAL | 0 refills | Status: AC

## 2017-04-29 MED ORDER — AMOXICILLIN-POT CLAVULANATE 875-125 MG PO TABS: 1.0000 | ORAL_TABLET | Freq: Two times a day (BID) | ORAL | 0 refills | Status: DC

## 2017-04-29 MED FILL — FLUCONAZOLE 150 MG TABLET: 150 | 1 days supply | Qty: 1 | Fill #0

## 2017-04-29 MED FILL — AMOX-CLAV 875-125 MG TABLET: 875-125 | 10 days supply | Qty: 20 | Fill #0

## 2017-04-29 MED FILL — FLUTICASONE PROP 50 MCG SPR: 50 | 7 days supply | Qty: 16 | Fill #0

## 2017-04-29 NOTE — Progress Notes (Addendum)
Subjective:    Patient ID: Stacey Wyatt, female    DOB: [birth year], 21 y.o.   MRN: [id number]  HPI  Pt in for follow up on ED visit. Pt had syncopal episode. This occurred Saturday night early in the am around 1:30. Pt friend states her arms roled back in her head and she passed out. She was feeling dizzy prior to this episode. Pt had loc for about 30 seconds. No incontinence. She did have frontal ha after the loc/syncope event. Pt had this before and I referred to both cardiologist and neurologist.   Pt went to cardiolgist and  EKG showed no evidence of arrhythmia. Echo was reassuring. No hypertrophy of the ventricular wall.   She subsequently saw Dr. Karel JarvisAquino (neurology) who recommended EEG and MRI with and without contrast. Patient did not obtain these tests. She was lost to follow-up. Pt states at that time she had very high co pay for insurance and did not follow through.  Currently only faint mild frontal ha but no neuruologic signs or symptoms on review. But does not almost weekly will have transient dizzy episodes that last 5 minutes but then will resolve if sits down and rests.  Regarding frontal sinus pressure/ha. She does admit year round allergies. Sounds nasal congested presently.Blows out colored mucous last week and this week.  Last time I saw pt was in 09-2015. She thinks she may have had 50 syncopal events.   Pregnancy test negative 2 days. LMP- last Thursday.  No cbc done in ED. But cmp was done. No ct of head done either.        Review of Systems  Constitutional: Negative for chills and fatigue.  HENT: Positive for congestion and sinus pressure. Negative for rhinorrhea.        Patient does report sinus pressure.  Respiratory: Negative for cough, chest tightness, shortness of breath and wheezing.   Cardiovascular: Negative for chest pain and palpitations.  Gastrointestinal: Negative for abdominal pain, blood in stool, constipation, diarrhea and vomiting.    Musculoskeletal: Negative for back pain and neck pain.  Skin: Negative for rash.  Neurological: Positive for headaches. Negative for dizziness, speech difficulty and weakness.       A mild headache but more in the frontal sinus area.  Hematological: Negative for adenopathy. Does not bruise/bleed easily.  Psychiatric/Behavioral: Negative for behavioral problems, confusion, hallucinations and sleep disturbance. The patient is not nervous/anxious.    Past Medical History:  Diagnosis Date  . Allergy   . Asthma   . Seasonal allergies   . Syncope      Social History   Social History  . Marital status: Single    Spouse name: N/A  . Number of children: N/A  . Years of education: N/A   Occupational History  . Not on file.   Social History Main Topics  . Smoking status: Never Smoker  . Smokeless tobacco: Never Used  . Alcohol use No  . Drug use: No  . Sexual activity: Yes    Birth control/ protection: None   Other Topics Concern  . Not on file   Social History Narrative  . No narrative on file    Past Surgical History:  Procedure Laterality Date  . NASAL POLYP SURGERY Bilateral     Family History  Problem Relation Age of Onset  . Hypertension Mother   . Allergic rhinitis Father     No Known Allergies  Current Outpatient Prescriptions on File Prior to Visit  Medication  Sig Dispense Refill  . albuterol (PROVENTIL HFA;VENTOLIN HFA) 108 (90 Base) MCG/ACT inhaler Inhale 2 puffs into the lungs every 6 (six) hours as needed for wheezing or shortness of breath. 1 Inhaler 0   No current facility-administered medications on file prior to visit.     BP 120/86   Pulse 76   Temp 97.6 F (36.4 C) (Oral)   Resp 16   Ht 5\' 9"  (1.753 m)   Wt 161 lb 3.2 oz (73.1 kg)   LMP 04/19/2017   SpO2 98%   BMI 23.81 kg/m       Objective:   Physical Exam  General Mental Status- Alert. General Appearance- Not in acute distress.   Skin General: Color- Normal Color. Moisture-  Normal Moisture.  Neck Carotid Arteries- Normal color. Moisture- Normal Moisture. No carotid bruits. No JVD.  Chest and Lung Exam Auscultation: Breath Sounds:-Normal.  Cardiovascular Auscultation:Rythm- Regular. Murmurs & Other Heart Sounds:Auscultation of the heart reveals- No Murmurs.  Abdomen Inspection:-Inspeection Normal. Palpation/Percussion:Note:No mass. Palpation and Percussion of the abdomen reveal- Non Tender, Non Distended + BS, no rebound or guarding.   Neurologic Cranial Nerve exam:- CN III-XII intact(No nystagmus), symmetric smile. Drift Test:- No drift. Romberg Exam:- Negative.  Finger to Nose:- Normal/Intact Strength:- 5/5 equal and symmetric strength both upper and lower extremities.   HEENT Head- Normal. Ear Auditory Canal - Left- Normal. Right - Normal.Tympanic Membrane- Left- Normal. Right- Normal. Eye Sclera/Conjunctiva- Left- Normal. Right- Normal. Allergic shiner Nose & Sinuses Nasal Mucosa- Left-  Boggy and Congested. Right-  Boggy and  Congested. Bilateral  No maxillary but frontal sinus pressure. Mouth & Throat Lips: Upper Lip- Normal: no dryness, cracking, pallor, cyanosis, or vesicular eruption. Lower Lip-Normal: no dryness, cracking, pallor, cyanosis or vesicular eruption. Buccal Mucosa- Bilateral- No Aphthous ulcers. Oropharynx- No Discharge or Erythema. Tonsils: Characteristics- Bilateral- No Erythema or Congestion. Size/Enlargement- Bilateral- No enlargement. Discharge- bilateral-None.    Lymphatic Head & Neck General Head & Neck Lymphatics: Bilateral: Description- No Localized lymphadenopathy.       Assessment & Plan:  For recent syncopal event and history of various in the past will refer you back to prior neurologist. Hopefully they will schedule you for the EEG and MRI which they had placed an order for the past. I do want to get a CBC today to check you are anemic. If you have any recurrent syncopal event that the seen in the  emergency department.  For allergic rhinitis I prescribed Flonase. Will probable sinus infection I prescribed Augmentin. Follow-up in 2 weeks or as needed.  If your neurology appointment is delayed them might consider trying to get imaging myself. Note that might start with CT.  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 She does have seizures. Patient expressed understanding  Mahogani Holohan, Ramon DredgeEdward, PA-C

## 2017-04-29 NOTE — Patient Instructions (Signed)
For recent syncopal event and history of various in the past will refer you back to prior neurologist. Hopefully they will schedule you for the EEG and MRI which they had placed an order for the past. I do want to get a CBC today to check you are anemic. If you have any recurrent syncopal event that the seen in the emergency department.  For allergic rhinitis I prescribed Flonase. Will probable sinus infection I prescribed Augmentin. Follow-up in 2 weeks or as needed.  If your neurology appointment is delayed them might consider trying to get imaging myself. Note that might start with CT.

## 2017-05-03 ENCOUNTER — Other Ambulatory Visit: Payer: Self-pay

## 2017-05-03 DIAGNOSIS — R55 Syncope and collapse: Secondary | ICD-10-CM

## 2017-05-05 ENCOUNTER — Ambulatory Visit: Payer: Managed Care, Other (non HMO)

## 2017-05-06 ENCOUNTER — Other Ambulatory Visit: Payer: Self-pay

## 2017-05-06 DIAGNOSIS — R55 Syncope and collapse: Secondary | ICD-10-CM

## 2017-05-12 ENCOUNTER — Ambulatory Visit (INDEPENDENT_AMBULATORY_CARE_PROVIDER_SITE_OTHER): Payer: Managed Care, Other (non HMO) | Admitting: Neurology

## 2017-05-12 ENCOUNTER — Encounter: Payer: Self-pay | Admitting: Neurology

## 2017-05-12 DIAGNOSIS — R55 Syncope and collapse: Secondary | ICD-10-CM

## 2017-05-12 NOTE — Procedures (Signed)
ELECTROENCEPHALOGRAM REPORT  Date of Study: 05/12/2017  Patient's Name: Alden Serverayshance Sanderfer MRN: [id number] Date of Birth: [birth year]  Referring Provider: Dr. Patrcia DollyKaren Aquino  Clinical History: This is a 21 year old woman with recurrent syncope and near syncope.  Medications: Proventil, multivitamin, BCP  Technical Summary: A multichannel digital 1-hour sleep-deprived EEG recording measured by the international 10-20 system with electrodes applied with paste and impedances below 5000 ohms performed in our laboratory with EKG monitoring in an awake and asleep patient.  Hyperventilation and photic stimulation were performed.  The digital EEG was referentially recorded, reformatted, and digitally filtered in a variety of bipolar and referential montages for optimal display.    Description: The patient is awake and asleep during the recording.  During maximal wakefulness, there is a symmetric, medium voltage 10 Hz posterior dominant rhythm that attenuates with eye opening.  The record is symmetric.  During drowsiness and sleep, there is an increase in theta slowing of the background.  Vertex waves and symmetric sleep spindles were seen.  Hyperventilation and photic stimulation did not elicit any abnormalities.  There were no epileptiform discharges or electrographic seizures seen.    EKG lead was unremarkable.  Impression: This 1-hour awake and asleep EEG is normal.    Clinical Correlation: A normal EEG does not exclude a clinical diagnosis of epilepsy.  If further clinical questions remain, prolonged EEG may be helpful.  Clinical correlation is advised.   Patrcia DollyKaren Aquino, M.D.

## 2017-05-21 ENCOUNTER — Ambulatory Visit
Admission: RE | Admit: 2017-05-21 | Discharge: 2017-05-21 | Disposition: A | Discharge status: Home / Self Care | Payer: Managed Care, Other (non HMO) | Source: Ambulatory Visit | Attending: Neurology | Admitting: Neurology

## 2017-05-21 DIAGNOSIS — R55 Syncope and collapse: Secondary | ICD-10-CM

## 2017-05-21 MED ORDER — GADOBENATE DIMEGLUMINE 529 MG/ML IV SOLN
15.0000 mL | Freq: Once | INTRAVENOUS | Status: AC | PRN
  Administered 2017-05-21: 15 mL via INTRAVENOUS

## 2017-05-24 ENCOUNTER — Telehealth: Payer: Self-pay

## 2017-05-24 NOTE — Telephone Encounter (Signed)
LMOM relaying message below.  Pt has f/u appointment scheduled for 8/22 @ 1:00PM

## 2017-05-24 NOTE — Telephone Encounter (Signed)
-----   Message from Van Clines, MD sent at 05/24/2017  9:01 AM EDT ----- Pls let her know MRI brain is normal, no evidence of tumor, stroke, or bleed. I have openings this week on Wed and Thurs, if she can come in to discuss results. Thanks

## 2017-05-26 ENCOUNTER — Ambulatory Visit: Payer: Managed Care, Other (non HMO) | Admitting: Neurology

## 2017-06-16 ENCOUNTER — Telehealth: Payer: Self-pay | Admitting: Medical

## 2017-06-16 NOTE — Telephone Encounter (Signed)
Relation to ZO:XWRUpt:self Call back [phone]   Reason for call:  Patient sent My Chart message requesting referral to GYN for annual pap, please advise   Patient sent MyChart Message mentioned below:  Hey Dr Alvira MondaySaguier I remember you saying that you can refer me to a good gynecologist for my Pap smear. Im ready to go ahead and get it scheduled if thats fine with you can you send me some suggestions or maybe schedule me an appointment whatever works best please write me back on here or contact me [phone]

## 2017-06-17 ENCOUNTER — Telehealth: Payer: Self-pay | Admitting: Medical

## 2017-06-17 DIAGNOSIS — Z124 Encounter for screening for malignant neoplasm of cervix: Secondary | ICD-10-CM

## 2017-06-17 NOTE — Telephone Encounter (Signed)
Please see referral to gyn. Call pt and notify her working on and double check if she prefers female only.

## 2017-06-21 NOTE — Telephone Encounter (Signed)
Referral has been placed. Now waiting for the appointment date? Notify patient please

## 2017-06-21 NOTE — Telephone Encounter (Signed)
Unable to lvm with patient due to VM being full

## 2017-07-09 ENCOUNTER — Encounter: Payer: Managed Care, Other (non HMO) | Admitting: Family Medicine

## 2017-07-13 IMAGING — US US OB TRANSVAGINAL
1 series · 15 of 28 positions shown · non-contrast
Comparison: 12/07/2016

CLINICAL DATA: Early stage pregnancy, followup, assess viability
and dating

EXAM:
TRANSVAGINAL OB ULTRASOUND
TECHNIQUE: Transvaginal ultrasound was performed for complete evaluation of the
gestation as well as the maternal uterus, adnexal regions, and
pelvic cul-de-sac.

[Series 1: us ob transvaginal · 15 of 49 slices shown]
[im 1/49]
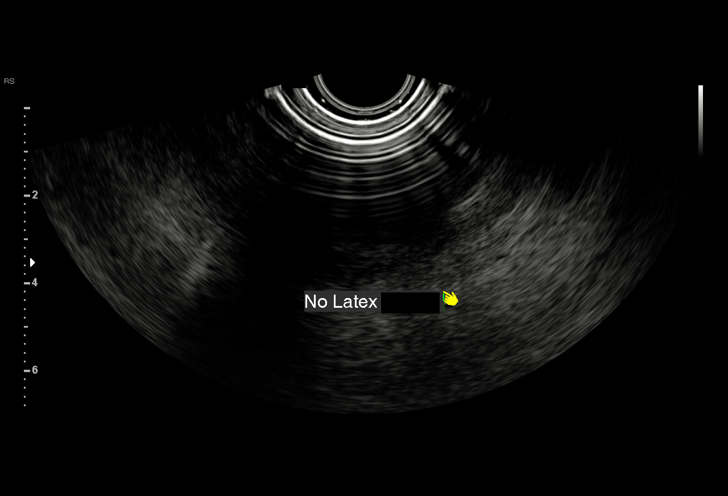
[im 4/49]
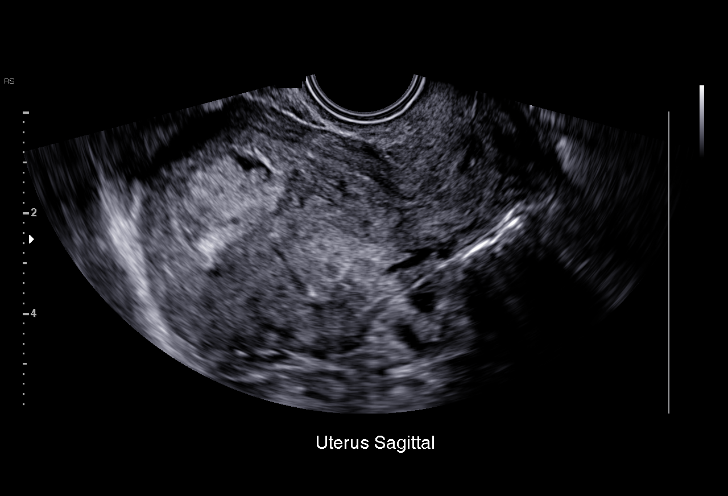
[im 8/49]
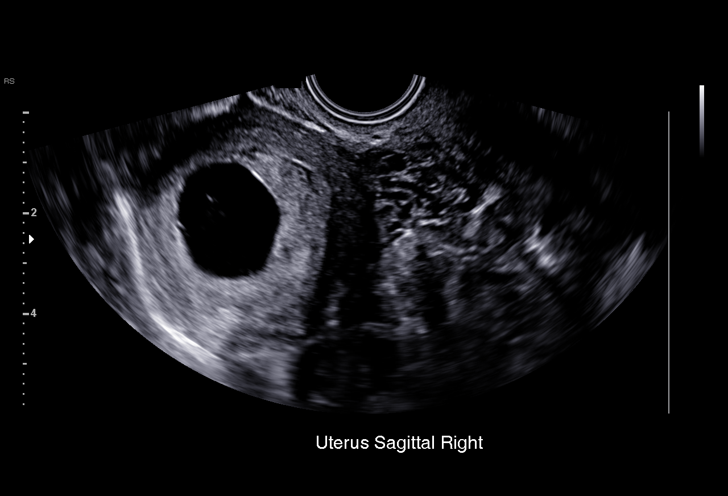
[im 11/49]
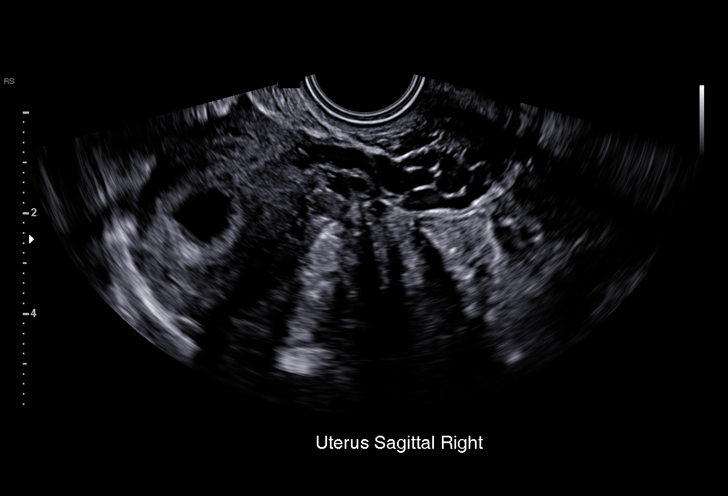
[im 15/49]
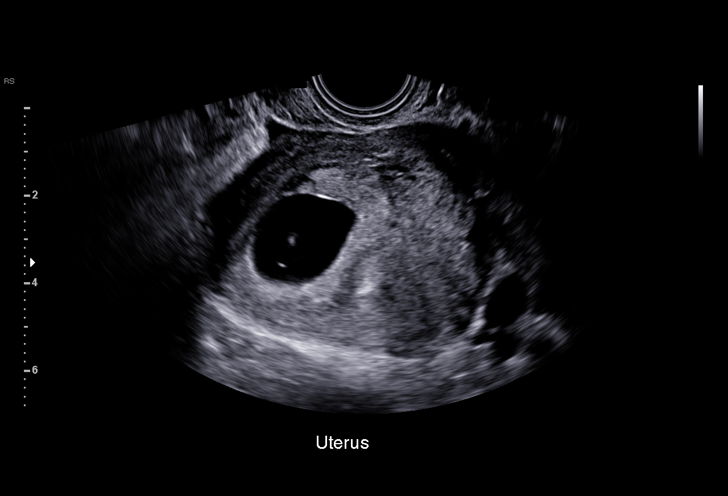
[im 18/49]
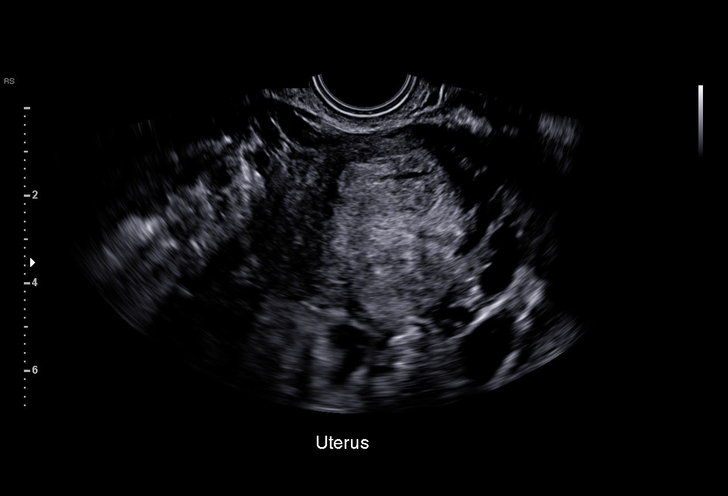
[im 22/49]
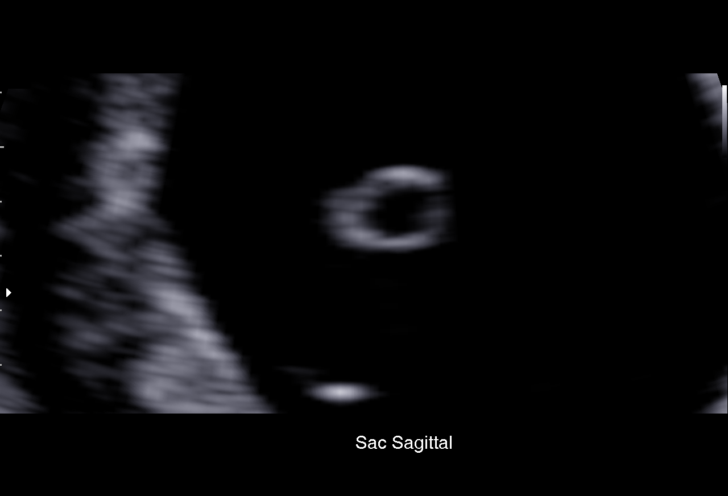
[im 25/49]
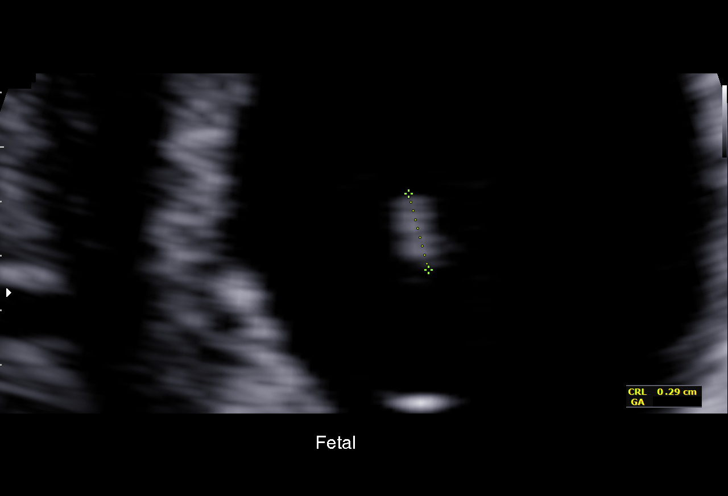
[im 27/49]
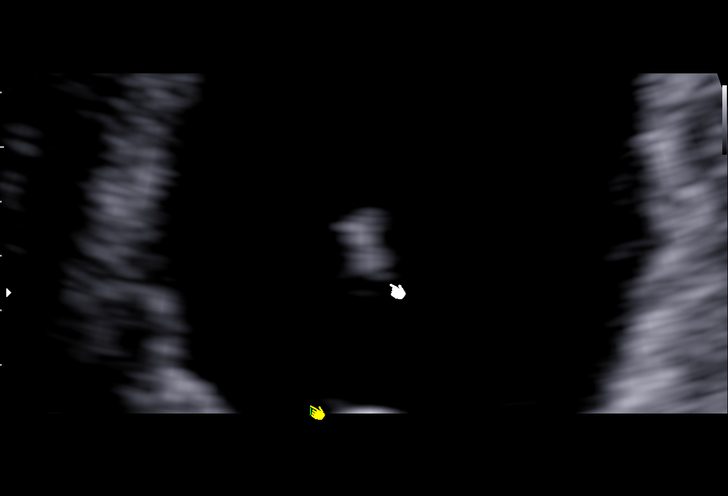
[im 31/49]
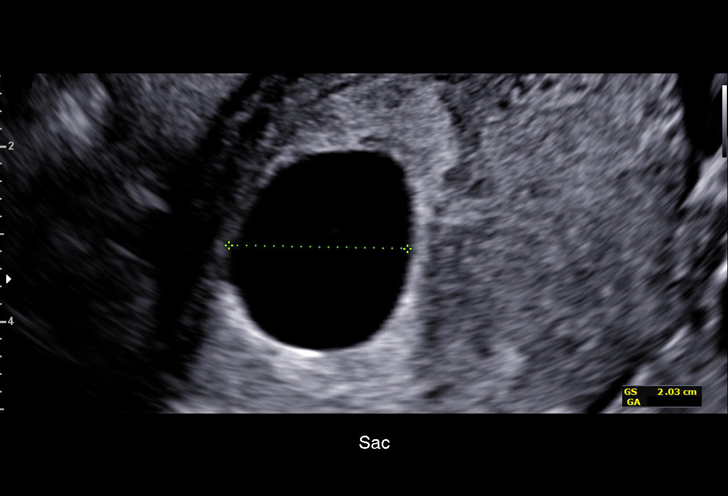
[im 34/49]
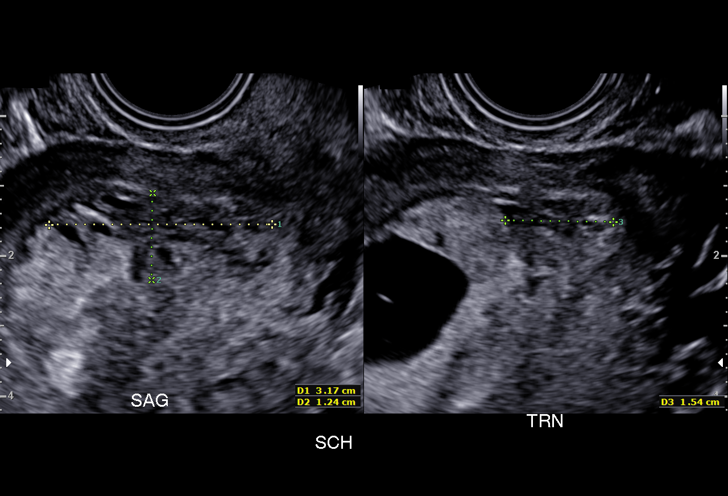
[im 38/49]
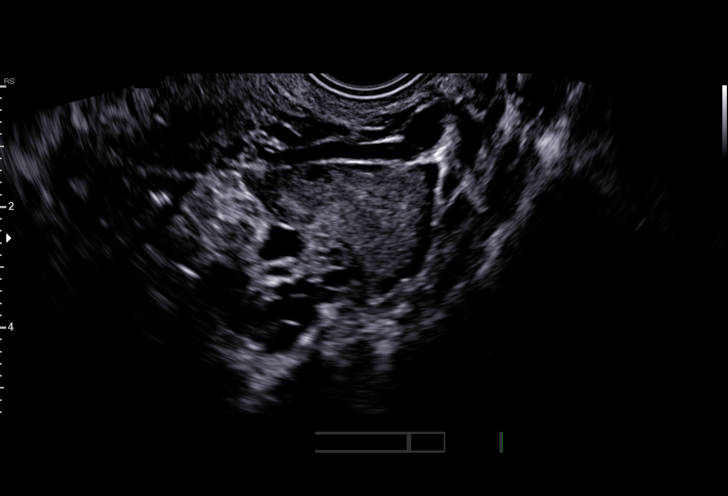
[im 41/49]
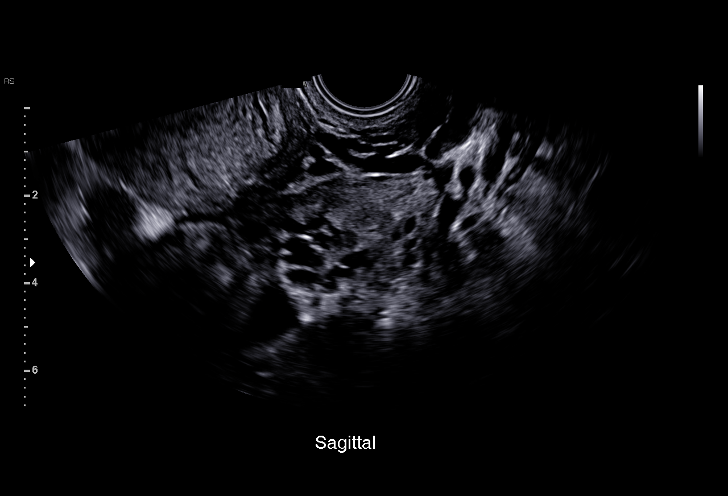
[im 45/49]
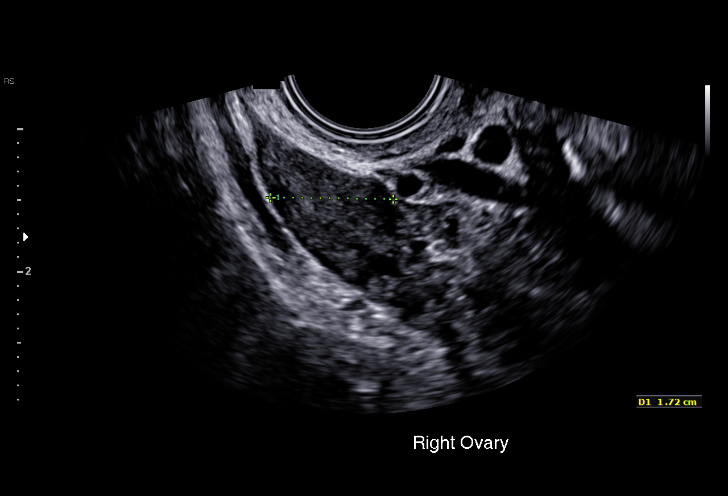
[im 49/49]
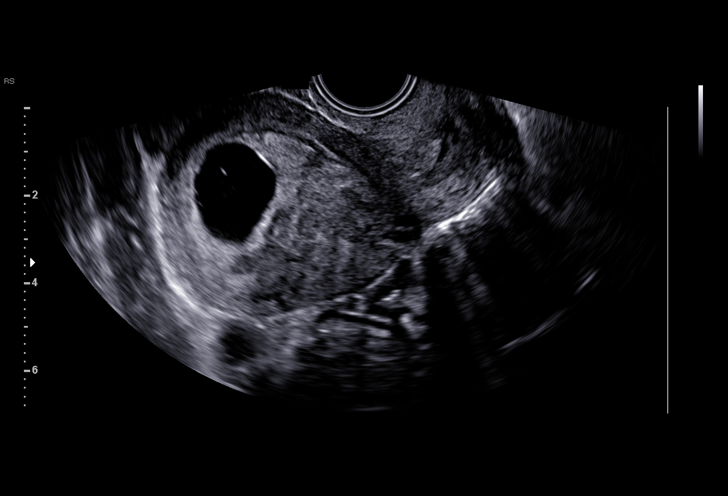

[15 of 28 positions shown; findings below may reference images not displayed]

FINDINGS: Intrauterine gestational sac: Present

Yolk sac:  Present

Embryo:  Present

Cardiac Activity: Absent

Heart Rate: N/A bpm

MSD: 21.1  mm   7 w   0 d

CRL:   2.9  mm   5 w 5 d

Subchorionic hemorrhage: Small subchorionic hemorrhage, 3.2 x 1.2 x
1.5 cm

Maternal uterus/adnexae: Uterus otherwise unremarkable. Ovaries
normal appearance with a probably corpus luteum in LEFT ovary. No
adnexal masses.
IMPRESSION: Intrauterine gestational sac containing a fetal pole and yolk sac.

Fetal cardiac activity however is no longer identified consistent
with fetal demise.

## 2017-07-16 ENCOUNTER — Other Ambulatory Visit (HOSPITAL_COMMUNITY)
Admission: RE | Admit: 2017-07-16 | Discharge: 2017-07-16 | Disposition: A | Discharge status: Home / Self Care | Payer: Managed Care, Other (non HMO) | Source: Ambulatory Visit | Attending: Obstetrics & Gynecology | Admitting: Obstetrics & Gynecology

## 2017-07-16 ENCOUNTER — Other Ambulatory Visit (HOSPITAL_COMMUNITY): Payer: Self-pay | Admitting: Advanced Practice Midwife

## 2017-07-16 ENCOUNTER — Ambulatory Visit (INDEPENDENT_AMBULATORY_CARE_PROVIDER_SITE_OTHER): Payer: Managed Care, Other (non HMO) | Admitting: Obstetrics & Gynecology

## 2017-07-16 ENCOUNTER — Encounter: Payer: Self-pay | Admitting: Obstetrics & Gynecology

## 2017-07-16 VITALS — BP 107/77 | HR 80 | Ht 70.0 in | Wt 157.0 lb

## 2017-07-16 DIAGNOSIS — Z1389 Encounter for screening for other disorder: Secondary | ICD-10-CM

## 2017-07-16 DIAGNOSIS — Z01419 Encounter for gynecological examination (general) (routine) without abnormal findings: Secondary | ICD-10-CM | POA: Insufficient documentation

## 2017-07-16 DIAGNOSIS — Z113 Encounter for screening for infections with a predominantly sexual mode of transmission: Secondary | ICD-10-CM

## 2017-07-16 MED ORDER — NORGESTREL-ETHINYL ESTRADIOL 0.3-30 MG-MCG PO TABS: 1.0000 | ORAL_TABLET | Freq: Every day | ORAL | 11 refills | Status: DC

## 2017-07-16 MED FILL — ELINEST-28 TABLET: 0.3-30 | 28 days supply | Qty: 28 | Fill #0

## 2017-07-16 NOTE — Progress Notes (Signed)
Subjective:    Stacey Wyatt is a 21 y.o. SAA G1P0A1 (in 4/18) female who presents for an annual exam. The patient has no complaints today. The patient is sexually active. GYN screening history: no prior history of gyn screening tests. The patient wears seatbelts: yes. The patient participates in regular exercise: yes. Has the patient ever been transfused or tattooed?: yes. The patient reports that there is not domestic violence in her life.   Menstrual History: OB History    Gravida Para Term Preterm AB Living   1       1     SAB TAB Ectopic Multiple Live Births   1              Menarche age: 29 Patient's last menstrual period was 07/06/2017.    The following portions of the patient's history were reviewed and updated as appropriate: allergies, current medications, past family history, past medical history, past social history, past surgical history and problem list.  Review of Systems Pertinent items are noted in HPI.   Works at [employer] center With partner for 3 years, he's probably not monogamous FH- no breast/gyn/colon cancer Doesn't use contraception, but doesn't want a pregnancy   Objective:    BP 107/77 (BP Location: Left Arm, Patient Position: Sitting)   Pulse 80   Ht  (1.778 m)   Wt 157 lb (71.2 kg)   LMP 07/06/2017   BMI 22.53 kg/m   General Appearance:    Alert, cooperative, no distress, appears stated age  Head:    Normocephalic, without obvious abnormality, atraumatic  Eyes:    PERRL, conjunctiva/corneas clear, EOM's intact, fundi    benign, both eyes  Ears:    Normal TM's and external ear canals, both ears  Nose:   Nares normal, septum midline, mucosa normal, no drainage    or sinus tenderness  Throat:   Lips, mucosa, and tongue normal; teeth and gums normal  Neck:   Supple, symmetrical, trachea midline, no adenopathy;    thyroid:  no enlargement/tenderness/nodules; no carotid   bruit or JVD  Back:     Symmetric, no curvature, ROM  normal, no CVA tenderness  Lungs:     Clear to auscultation bilaterally, respirations unlabored  Chest Wall:    No tenderness or deformity   Heart:    Regular rate and rhythm, S1 and S2 normal, no murmur, rub   or gallop  Breast Exam:    No tenderness, masses, or nipple abnormality  Abdomen:     Soft, non-tender, bowel sounds active all four quadrants,    no masses, no organomegaly  Genitalia:    Normal female without lesion, discharge or tenderness, NSSA, NT, normal adnexal exam     Extremities:   Extremities normal, atraumatic, no cyanosis or edema  Pulses:   2+ and symmetric all extremities  Skin:   Skin color, texture, turgor normal, no rashes or lesions  Lymph nodes:   Cervical, supraclavicular, and axillary nodes normal  Neurologic:   CNII-XII intact, normal strength, sensation and reflexes    throughout  .    Assessment:    Healthy female exam.    Plan:     Chlamydia specimen. GC specimen. Thin prep Pap smear.   STI testing She declines a flu vaccine Start OCPs for contraception- start today, use condoms for 4 weeks She will research Gardasil, is aware that it is rec'd

## 2017-07-18 LAB — HEPATITIS C ANTIBODY

## 2017-07-18 LAB — HEPATITIS B SURFACE ANTIGEN: HEP B S AG: NEGATIVE

## 2017-07-18 LAB — RPR: RPR: NONREACTIVE

## 2017-07-18 LAB — HIV ANTIBODY (ROUTINE TESTING W REFLEX): HIV Screen 4th Generation wRfx: NONREACTIVE

## 2017-07-19 MED ORDER — ALBUTEROL SULFATE HFA 108 (90 BASE) MCG/ACT IN AERS: 2.0000 | INHALATION_SPRAY | Freq: Four times a day (QID) | RESPIRATORY_TRACT | 5 refills | Status: DC | PRN

## 2017-07-22 ENCOUNTER — Emergency Department (HOSPITAL_COMMUNITY): Payer: Managed Care, Other (non HMO)

## 2017-07-22 ENCOUNTER — Emergency Department (HOSPITAL_COMMUNITY)
Admission: EM | Admit: 2017-07-22 | Discharge: 2017-07-22 | Disposition: A | Discharge status: Home / Self Care | Payer: Managed Care, Other (non HMO) | Attending: Emergency Medicine | Admitting: Emergency Medicine

## 2017-07-22 ENCOUNTER — Encounter (HOSPITAL_COMMUNITY): Payer: Self-pay | Admitting: Emergency Medicine

## 2017-07-22 DIAGNOSIS — S99922A Unspecified injury of left foot, initial encounter: Secondary | ICD-10-CM | POA: Diagnosis present

## 2017-07-22 DIAGNOSIS — X509XXA Other and unspecified overexertion or strenuous movements or postures, initial encounter: Secondary | ICD-10-CM | POA: Insufficient documentation

## 2017-07-22 DIAGNOSIS — S93602A Unspecified sprain of left foot, initial encounter: Secondary | ICD-10-CM | POA: Insufficient documentation

## 2017-07-22 DIAGNOSIS — Y998 Other external cause status: Secondary | ICD-10-CM | POA: Diagnosis not present

## 2017-07-22 DIAGNOSIS — Y9367 Activity, basketball: Secondary | ICD-10-CM | POA: Diagnosis not present

## 2017-07-22 DIAGNOSIS — J45909 Unspecified asthma, uncomplicated: Secondary | ICD-10-CM | POA: Insufficient documentation

## 2017-07-22 DIAGNOSIS — Y92310 Basketball court as the place of occurrence of the external cause: Secondary | ICD-10-CM | POA: Diagnosis not present

## 2017-07-22 LAB — CYTOLOGY - PAP
BACTERIAL VAGINITIS: POSITIVE — AB
CHLAMYDIA, DNA PROBE: NEGATIVE
Candida vaginitis: NEGATIVE
DIAGNOSIS: NEGATIVE
NEISSERIA GONORRHEA: POSITIVE — AB
Trichomonas: NEGATIVE

## 2017-07-22 MED ORDER — IBUPROFEN 600 MG PO TABS: 600.0000 mg | ORAL_TABLET | Freq: Four times a day (QID) | ORAL | 0 refills | Status: DC | PRN

## 2017-07-22 MED ORDER — IBUPROFEN 400 MG PO TABS
800.0000 mg | ORAL_TABLET | Freq: Once | ORAL | Status: AC
  Administered 2017-07-22: 800 mg via ORAL
  Filled 2017-07-22: qty 2

## 2017-07-22 NOTE — ED Notes (Signed)
Pt returned from xray

## 2017-07-22 NOTE — ED Notes (Signed)
Pt reports she was playing basketball yesterday evening when she jumped up to dunk and landed on her L foot wrong. Reports 10/10 pain, no deformity or swelling noted.

## 2017-07-22 NOTE — ED Notes (Signed)
Ice applied to left foot.

## 2017-07-22 NOTE — Progress Notes (Signed)
Orthopedic Tech Progress Note Patient Details:  Stacey Wyatt 1996/09/27 [id number]  Ortho Devices Type of Ortho Device: Crutches Ortho Device/Splint Interventions: Ordered, Application, Adjustment   [name], Ernest Popowski J 07/22/2017, 3:08 AM

## 2017-07-22 NOTE — ED Notes (Signed)
Pt transported to xray 

## 2017-07-22 NOTE — ED Provider Notes (Signed)
[facility] Provider Note   CSN: [id number] Arrival date & time: 07/22/17  0118     History   Chief Complaint Chief Complaint  Patient presents with  . Leg Pain    HPI Stacey Wyatt is a 21 y.o. female.  21 year old female presents to the emergency department for evaluation of left foot pain. She was playing basketball at midnight when she landed poorly on her left foot. She reports 10/10 sharp pain which radiates to her left calf. No medications taken prior to arrival for symptoms. She has had aggravation of her pain with weightbearing. No prior injury to the left foot.   The history is provided by the patient. No language interpreter was used.  Foot Pain  This is a new problem. The current episode started 3 to 5 hours ago. The problem occurs constantly. The problem has not changed since onset.The symptoms are aggravated by walking. Nothing relieves the symptoms. She has tried rest for the symptoms. The treatment provided no relief.    Past Medical History:  Diagnosis Date  . Allergy   . Asthma   . Seasonal allergies   . Syncope     Patient Active Problem List   Diagnosis Date Noted  . Faintness 10/11/2015  . Asthma   . Seasonal allergies   . Allergy   . Syncope     Past Surgical History:  Procedure Laterality Date  . NASAL POLYP SURGERY Bilateral     OB History    Gravida Para Term Preterm AB Living   1       1     SAB TAB Ectopic Multiple Live Births   1               Home Medications    Prior to Admission medications   Medication Sig Start Date End Date Taking? Authorizing Provider  albuterol (PROVENTIL HFA;VENTOLIN HFA) 108 (90 Base) MCG/ACT inhaler Inhale 2 puffs into the lungs every 6 (six) hours as needed for wheezing or shortness of breath. 07/19/17   Saguier, Ramon DredgeEdward, PA-C  amoxicillin-clavulanate (AUGMENTIN) 875-125 MG tablet Take 1 tablet by mouth 2 (two) times daily. Patient not taking: Reported on  07/16/2017 04/29/17   Saguier, Ramon DredgeEdward, PA-C  fluticasone [facility](FLONASE) 50 MCG/ACT nasal spray Place 2 sprays into both nostrils daily. 04/29/17   Saguier, Ramon DredgeEdward, PA-C  ibuprofen (ADVIL,MOTRIN) 600 MG tablet Take 1 tablet (600 mg total) by mouth every 6 (six) hours as needed. 07/22/17   Antony MaduraHumes, Alleigh Mollica, PA-C  norgestrel-ethinyl estradiol (LO/OVRAL,CRYSELLE) 0.3-30 MG-MCG tablet Take 1 tablet by mouth daily. 07/16/17   Allie Bossierove, Myra C, MD    Family History Family History  Problem Relation Age of Onset  . Hypertension Mother   . Allergic rhinitis Father     Social History Social History  Substance Use Topics  . Smoking status: Never Smoker  . Smokeless tobacco: Never Used  . Alcohol use No     Allergies   Patient has no known allergies.   Review of Systems Review of Systems Ten systems reviewed and are negative for acute change, except as noted in the HPI.    Physical Exam Updated Vital Signs BP (!) 126/94 (BP Location: Right Arm)   Pulse 91   Temp 98 F (36.7 C) (Oral)   Resp 18   Ht 5\' 10"  (1.778 m)   Wt 71.2 kg (157 lb)   LMP 07/06/2017   SpO2 100%   BMI 22.53 kg/m   Physical Exam  Constitutional: She is oriented to person, place, and time. She appears well-developed and well-nourished. No distress.  Nontoxic appearing and in NAD  HENT:  Head: Normocephalic and atraumatic.  Eyes: Conjunctivae and EOM are normal. No scleral icterus.  Neck: Normal range of motion.  Cardiovascular: Normal rate, regular rhythm and intact distal pulses.   DP and PT pulse 2+ in the LLE  Pulmonary/Chest: Effort normal. No respiratory distress.  Respirations even and unlabored  Musculoskeletal: Normal range of motion.       Left foot: There is tenderness. There is no swelling, normal capillary refill and no crepitus.       Feet:  Neurological: She is alert and oriented to person, place, and time. She exhibits normal muscle tone. Coordination normal.  Sensation to light touch intact in all  digits of the left foot. Patient able to wiggle all toes.  Skin: Skin is warm and dry. No rash noted. She is not diaphoretic. No erythema. No pallor.  Psychiatric: She has a normal mood and affect. Her behavior is normal.  Nursing note and vitals reviewed.    ED Treatments / Results  Labs (all labs ordered are listed, but only abnormal results are displayed) Labs Reviewed - No data to display  EKG  EKG Interpretation None       Radiology Dg Foot Complete Left  Result Date: 07/22/2017 CLINICAL DATA:  Twisting injury to the left foot while playing basketball. Medial left foot pain. EXAM: LEFT FOOT - COMPLETE 3+ VIEW COMPARISON:  None. FINDINGS: There is no evidence of fracture or dislocation. There is no evidence of arthropathy or other focal bone abnormality. Soft tissues are unremarkable. IMPRESSION: Negative. Electronically Signed   By: Burman Nieves M.D.   On: 07/22/2017 02:45    Procedures Procedures (including critical care time)  Medications Ordered in ED Medications  ibuprofen (ADVIL,MOTRIN) tablet 800 mg (800 mg Oral Given 07/22/17 0254)     Initial Impression / Assessment and Plan / ED Course  I have reviewed the triage vital signs and the nursing notes.  Pertinent labs & imaging results that were available during my care of the patient were reviewed by me and considered in my medical decision making (see chart for details).     21 year old female presents for left foot pain after landing poorly on her left foot while playing basketball tonight. She is neurovascularly intact. No bony deformity, crepitus, swelling. X-ray negative for fracture, dislocation, bony deformity. Will manage supportively with Ace wrap, NSAIDs, and crutches for WBAT. Return precautions discussed and provided. Patient discharged in stable condition with no unaddressed concerns.   Final Clinical Impressions(s) / ED Diagnoses   Final diagnoses:  Sprain of left foot, initial encounter     New Prescriptions New Prescriptions   IBUPROFEN (ADVIL,MOTRIN) 600 MG TABLET    Take 1 tablet (600 mg total) by mouth every 6 (six) hours as needed.     Antony Madura, PA-C 07/22/17 Donnal Debar    Zadie Rhine, MD 07/22/17 [phone]

## 2017-07-23 ENCOUNTER — Encounter: Payer: Self-pay | Admitting: Medical

## 2017-07-23 ENCOUNTER — Telehealth: Payer: Self-pay | Admitting: Medical

## 2017-07-23 ENCOUNTER — Ambulatory Visit (HOSPITAL_BASED_OUTPATIENT_CLINIC_OR_DEPARTMENT_OTHER)
Admission: RE | Admit: 2017-07-23 | Discharge: 2017-07-23 | Disposition: A | Discharge status: Home / Self Care | Payer: Managed Care, Other (non HMO) | Source: Ambulatory Visit | Attending: Medical | Admitting: Medical

## 2017-07-23 ENCOUNTER — Ambulatory Visit (INDEPENDENT_AMBULATORY_CARE_PROVIDER_SITE_OTHER): Payer: Managed Care, Other (non HMO) | Admitting: Medical

## 2017-07-23 ENCOUNTER — Other Ambulatory Visit (INDEPENDENT_AMBULATORY_CARE_PROVIDER_SITE_OTHER): Payer: Managed Care, Other (non HMO) | Admitting: *Deleted

## 2017-07-23 VITALS — BP 120/84 | HR 78 | Temp 97.5°F | Resp 16 | Ht 70.0 in | Wt 158.6 lb

## 2017-07-23 DIAGNOSIS — A549 Gonococcal infection, unspecified: Secondary | ICD-10-CM | POA: Diagnosis not present

## 2017-07-23 DIAGNOSIS — M25572 Pain in left ankle and joints of left foot: Secondary | ICD-10-CM | POA: Diagnosis not present

## 2017-07-23 DIAGNOSIS — M898X6 Other specified disorders of bone, lower leg: Secondary | ICD-10-CM | POA: Diagnosis not present

## 2017-07-23 DIAGNOSIS — M79672 Pain in left foot: Secondary | ICD-10-CM | POA: Diagnosis not present

## 2017-07-23 MED ORDER — CEFTRIAXONE SODIUM 250 MG IJ SOLR
250.0000 mg | Freq: Once | INTRAMUSCULAR | Status: AC
  Administered 2017-07-23: 250 mg via INTRAMUSCULAR

## 2017-07-23 MED ORDER — AZITHROMYCIN 250 MG PO TABS: 1000.0000 mg | ORAL_TABLET | Freq: Once | ORAL | 0 refills | Status: DC

## 2017-07-23 MED ORDER — DICLOFENAC SODIUM 75 MG PO TBEC: 75.0000 mg | DELAYED_RELEASE_TABLET | Freq: Two times a day (BID) | ORAL | 0 refills | Status: DC

## 2017-07-23 MED FILL — DICLOFENAC SODIUM 75 MG TAB: 75 | 15 days supply | Qty: 30 | Fill #0

## 2017-07-23 NOTE — Progress Notes (Signed)
Subjective:    Patient ID: Stacey Wyatt, female    DOB: 04/30/1996, 21 y.o.   MRN: [id number]  HPI  Pt in for left ankle pain since Wednesday morning. She reports when she playing basketball and fell felt pain on distal/bottom aspect of her left foot. When she stepped or twisted her foot pain would radiate up to anterior shin area up to knee.  Xray of her foot was normal. Pt still has pain despite using ibuprofen. Pt works in [employer]. She drivers a Advertising copywritercherry picker. Pt currently needs crutches to walk.    LMP- Jul 10, 2017    Review of Systems  Constitutional: Negative for chills, fatigue and fever.  Respiratory: Negative for cough, chest tightness, shortness of breath and wheezing.   Cardiovascular: Negative for chest pain and palpitations.  Gastrointestinal: Negative for abdominal pain.  Musculoskeletal:       Lt foot - pain on palpation and walking.   See hpi for other areas as well.    Skin: Negative for rash.  Neurological: Negative for dizziness and headaches.  Hematological: Negative for adenopathy. Does not bruise/bleed easily.   Past Medical History:  Diagnosis Date  . Allergy   . Asthma   . Seasonal allergies   . Syncope      Social History   Social History  . Marital status: Single    Spouse name: N/A  . Number of children: N/A  . Years of education: N/A   Occupational History  . Not on file.   Social History Main Topics  . Smoking status: Never Smoker  . Smokeless tobacco: Never Used  . Alcohol use No  . Drug use: No  . Sexual activity: Yes    Birth control/ protection: None   Other Topics Concern  . Not on file   Social History Narrative  . No narrative on file    Past Surgical History:  Procedure Laterality Date  . NASAL POLYP SURGERY Bilateral     Family History  Problem Relation Age of Onset  . Hypertension Mother   . Allergic rhinitis Father     No Known Allergies  Current Outpatient Prescriptions on File Prior to  Visit  Medication Sig Dispense Refill  . albuterol (PROVENTIL HFA;VENTOLIN HFA) 108 (90 Base) MCG/ACT inhaler Inhale 2 puffs into the lungs every 6 (six) hours as needed for wheezing or shortness of breath. 18 g 5  . ibuprofen (ADVIL,MOTRIN) 600 MG tablet Take 1 tablet (600 mg total) by mouth every 6 (six) hours as needed. 30 tablet 0  . norgestrel-ethinyl estradiol (LO/OVRAL,CRYSELLE) 0.3-30 MG-MCG tablet Take 1 tablet by mouth daily. 1 Package 11   No current facility-administered medications on file prior to visit.     BP 120/84   Pulse 78   Temp (!) 97.5 F (36.4 C) (Oral)   Resp 16   Ht 5\' 10"  (1.778 m)   Wt 158 lb 9.6 oz (71.9 kg)   LMP 07/06/2017   SpO2 99%   BMI 22.76 kg/m       Objective:   Physical Exam  General- No acute distress. Pleasant patient. Neck- Full range of motion, no jvd Lungs- Clear, even and unlabored. Heart- regular rate and rhythm. Neurologic- CNII- XII grossly intact. Left knee- good range of motion. Some crepitus. No pain on palpation. Left tibia- distal 1/3 tender. Left ankle- medial and lateral aspect tender. Mild swollen. Left foot- bottom aspect distal metatarsal tenderness. Mid foot pain. Left calf-no swelling and no tenderness to  palpation.symmetric compared to the right side. Negative Homans sign.    Assessment & Plan:  For your left lower extremity pain after the fall, I am ordering x-ray of your kidney are and ankle.  Diclofenac for pain and inflammation.Use the Ace wrap that emergency department gave. I would extend the wrap from  from ankle to distal foot. Use crutches daily. We discussed on how to modify crutch to decrease the underarm irritation.( proper fit)  Modified work sheet filled out.  Follow-up in one week to assess how you are doing and to see your work restrictions can be lifted.

## 2017-07-23 NOTE — Progress Notes (Signed)
Pt is in office for Rocephin injection for +GC. Azithromycin 1 gram has been sent to the pharmacy today. Pt has been made aware of all STD recommendation and need for repeat testing.  Pt has no other concerns today.

## 2017-07-23 NOTE — Telephone Encounter (Signed)
Made a copy of pt's fitness form for pcp per request. Placed in tray for pcp.

## 2017-07-23 NOTE — Patient Instructions (Addendum)
For your left lower extremity pain after the fall, I am ordering x-ray of your kidney are and ankle.  Diclofenac for pain and inflammation.Use the Ace wrap that emergency department gave. I would extend the wrap from ankle to distal foot. Use crutches daily. We discussed on how to modify crutch to decrease the underarm irritation.( proper fit)  Modified work sheet filled out.  Follow-up in one week to assess how you are doing and to see your work restrictions can be lifted.  Also to see if you need sports medicine referral

## 2017-07-28 ENCOUNTER — Ambulatory Visit (INDEPENDENT_AMBULATORY_CARE_PROVIDER_SITE_OTHER): Payer: Managed Care, Other (non HMO) | Admitting: Medical

## 2017-07-28 VITALS — BP 122/76 | HR 68 | Temp 98.3°F | Resp 16 | Ht 70.0 in | Wt 161.2 lb

## 2017-07-28 DIAGNOSIS — M79605 Pain in left leg: Secondary | ICD-10-CM | POA: Diagnosis not present

## 2017-07-28 MED ORDER — ALBUTEROL SULFATE HFA 108 (90 BASE) MCG/ACT IN AERS: 2.0000 | INHALATION_SPRAY | Freq: Four times a day (QID) | RESPIRATORY_TRACT | 5 refills | Status: DC | PRN

## 2017-07-28 MED FILL — PROAIR HFA 90 MCG INHALER: 108 (90 BAS | 25 days supply | Qty: 9 | Fill #0

## 2017-07-28 NOTE — Progress Notes (Signed)
Subjective:    Patient ID: Stacey Wyatt, female    DOB: 1996-09-05, 21 y.o.   MRN: [id number]  HPI  Pt in for follow up.   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 She states HR stated duty was too restrictive and she was sent home.  Pt xrays were all negative.  I gave diclofenac for pain and inflammation.  Pt never got used to crtuches. Does not want to use.  She states some improved but not completely.       Review of Systems  Constitutional: Negative for chills, fatigue and fever.  Respiratory: Negative for cough, chest tightness, shortness of breath and wheezing.   Cardiovascular: Negative for chest pain and palpitations.  Gastrointestinal: Negative for abdominal pain.  Musculoskeletal:       Left leg pain. See hpi and exam.  Skin: Negative for rash.  Neurological: Negative for dizziness, speech difficulty, weakness, numbness and headaches.  Hematological: Negative for adenopathy. Does not bruise/bleed easily.  Psychiatric/Behavioral: Negative for confusion.   Past Medical History:  Diagnosis Date  . Allergy   . Asthma   . Seasonal allergies   . Syncope      Social History   Social History  . Marital status: Single    Spouse name: N/A  . Number of children: N/A  . Years of education: N/A   Occupational History  . Not on file.   Social History Main Topics  . Smoking status: Never Smoker  . Smokeless tobacco: Never Used  . Alcohol use No  . Drug use: No  . Sexual activity: Yes    Birth control/ protection: None   Other Topics Concern  . Not on file   Social History Narrative  . No narrative on file    Past Surgical History:  Procedure Laterality Date  . NASAL POLYP SURGERY Bilateral     Family History  Problem Relation Age of Onset  . Hypertension Mother   . Allergic rhinitis Father     No Known Allergies  Current Outpatient Prescriptions on  File Prior to Visit  Medication Sig Dispense Refill  . albuterol (PROVENTIL HFA;VENTOLIN HFA) 108 (90 Base) MCG/ACT inhaler Inhale 2 puffs into the lungs every 6 (six) hours as needed for wheezing or shortness of breath. 18 g 5  . diclofenac (VOLTAREN) 75 MG EC tablet Take 1 tablet (75 mg total) by mouth 2 (two) times daily. 30 tablet 0  . norgestrel-ethinyl estradiol (LO/OVRAL,CRYSELLE) 0.3-30 MG-MCG tablet Take 1 tablet by mouth daily. 1 Package 11   No current facility-administered medications on file prior to visit.     BP 122/76   Pulse 68   Temp 98.3 F (36.8 C) (Oral)   Resp 16   Ht 5\' 10"  (1.778 m)   Wt 161 lb 3.2 oz (73.1 kg)   LMP 07/10/2017   SpO2 100%   BMI 23.13 kg/m       Objective:   Physical Exam   General- No acute distress. Pleasant patient. Neck- Full range of motion, no jvd Lungs- Clear, even and unlabored. Heart- regular rate and rhythm. Neurologic- CNII- XII grossly intact. Left knee- good range of motion. Some crepitus. No pain on palpation. Left tibia- distal 1/3 tender. Left ankle- medial and lateral aspect tender. Mild swollen but less than before.  Left foot- bottom aspect distal metatarsal tenderness and  Mid foot pain now much  less tender. Left calf-no swelling and no tenderness to palpation.symmetric compared to the right side. Negative Homans sign.      Assessment & Plan:  You still have some pain in your left knee, tibia, calf and ankle region.  The pain is improved some and I refilled your modified duty note to allow you to work but noted not to  operate heavy machinery.  Also specified in the form of no repetitive bending or stooping.  If they do give you a duty that think is too much please let me know.  Can continue diclofenac for pain and inflammation.  I do think that it would be beneficial to go ahead and arrange you an appointment with sports medicine since your pain is lingering and it might interfere with your regular  duty.  Follow-up as needed after sports medicine evaluation  Stacey Wyatt, Ramon Dredgedward, [place]PA-C

## 2017-07-28 NOTE — Patient Instructions (Addendum)
You still have some pain in your left knee, tibia, calf and ankle region.  The pain is improved some and I refilled your modified duty noted not to operate heavy machinery.  Also specified in the form of no repetitive bending or stooping.  If they do give you a duty that think is too much please let me know.  Can continue diclofenac for pain and inflammation.  I do think that it would be beneficial to go ahead and arrange you an appointment with sports medicine since your pain is lingering and it might interfere with your regular duty.  Follow-up as needed after sports medicine evaluation.

## 2017-07-30 ENCOUNTER — Ambulatory Visit: Payer: Managed Care, Other (non HMO) | Admitting: Medical

## 2017-08-06 ENCOUNTER — Telehealth: Payer: Self-pay

## 2017-08-06 NOTE — Telephone Encounter (Signed)
Attempted to contact pt unable to LM due to phone kept ringing then just hanging up.  [facility]

## 2017-08-06 NOTE — Telephone Encounter (Signed)
-----   Message from Allie BossierMyra C Dove, MD sent at 07/26/2017  9:50 AM EDT ----- She is + for gonorrhea and BV and will need treatment. Partner will need treated also.

## 2017-08-13 ENCOUNTER — Other Ambulatory Visit: Payer: Self-pay | Admitting: General Practice

## 2017-08-23 MED ORDER — METRONIDAZOLE 500 MG PO TABS: 500.0000 mg | ORAL_TABLET | Freq: Two times a day (BID) | ORAL | 0 refills | Status: DC

## 2017-08-23 NOTE — Telephone Encounter (Signed)
Letter sent.  Unable to contact pt.

## 2017-09-09 MED FILL — metroNIDAZOLE 500 MG TABS: 500 | 7 days supply | Qty: 14 | Fill #0

## 2017-09-16 ENCOUNTER — Ambulatory Visit (INDEPENDENT_AMBULATORY_CARE_PROVIDER_SITE_OTHER): Payer: Managed Care, Other (non HMO) | Admitting: Obstetrics & Gynecology

## 2017-09-16 ENCOUNTER — Other Ambulatory Visit (HOSPITAL_COMMUNITY)
Admission: RE | Admit: 2017-09-16 | Discharge: 2017-09-16 | Disposition: A | Discharge status: Home / Self Care | Payer: Managed Care, Other (non HMO) | Source: Ambulatory Visit | Attending: Obstetrics & Gynecology | Admitting: Obstetrics & Gynecology

## 2017-09-16 ENCOUNTER — Encounter: Payer: Self-pay | Admitting: Obstetrics & Gynecology

## 2017-09-16 VITALS — BP 132/86 | HR 94 | Ht 70.0 in | Wt 166.0 lb

## 2017-09-16 DIAGNOSIS — B9689 Other specified bacterial agents as the cause of diseases classified elsewhere: Secondary | ICD-10-CM

## 2017-09-16 DIAGNOSIS — N760 Acute vaginitis: Secondary | ICD-10-CM

## 2017-09-16 DIAGNOSIS — A549 Gonococcal infection, unspecified: Secondary | ICD-10-CM

## 2017-09-16 DIAGNOSIS — N921 Excessive and frequent menstruation with irregular cycle: Secondary | ICD-10-CM

## 2017-09-16 MED ORDER — METRONIDAZOLE 0.75 % VA GEL: 1.0000 | Freq: Every day | VAGINAL | 2 refills | Status: DC

## 2017-09-16 MED FILL — metroNIDAZOLE 0.75 % GEL: 0.75 | 5 days supply | Qty: 70 | Fill #0

## 2017-09-16 NOTE — Patient Instructions (Addendum)
Return to clinic for any scheduled appointments or for any gynecologic concerns as needed.    Xulane birth control patch -> investigate this option.

## 2017-09-16 NOTE — Progress Notes (Signed)
GYNECOLOGY OFFICE VISIT NOTE  History:  21 y.o. G1P0010 here today for test-of-cure after treatment for gonorrhea that was diagnosed on 07/16/2017. Was also diagnosed with BV, which she reports she gets occasionally.  Patient was treated on 07/23/2017, denies any unprotected contact with her partner.  She also reports some breakthrough bleeding on her OCPs; but reports she misses pills occasionally.  She did not complete her Metronidazole BV regimen due to nausea, desires vaginal therapy.  She denies anycurrent pelvic pain or other concerns.   Past Medical History:  Diagnosis Date  . Allergy   . Asthma   . Seasonal allergies   . Syncope     Past Surgical History:  Procedure Laterality Date  . NASAL POLYP SURGERY Bilateral     The following portions of the patient's history were reviewed and updated as appropriate: allergies, current medications, past family history, past medical history, past social history, past surgical history and problem list.   Health Maintenance:  Normal pap on 07/16/2017.  Review of Systems:  Pertinent items noted in HPI and remainder of comprehensive ROS otherwise negative.  Objective:  Physical Exam BP 132/86 (BP Location: Left Arm)   Pulse 94   Ht 5\' 10"  (1.778 m)   Wt 166 lb (75.3 kg)   LMP 08/22/2017 (Exact Date)   BMI 23.82 kg/m  CONSTITUTIONAL: Well-developed, well-nourished female in no acute distress.  HENT:  Normocephalic, atraumatic. External right and left ear normal. Oropharynx is clear and moist EYES: Conjunctivae and EOM are normal. Pupils are equal, round, and reactive to light. No scleral icterus.  NECK: Normal range of motion, supple, no masses SKIN: Skin is warm and dry. No rash noted. Not diaphoretic. No erythema. No pallor. NEUROLOGIC: Alert and oriented to person, place, and time. Normal reflexes, muscle tone coordination. No cranial nerve deficit noted. PSYCHIATRIC: Normal mood and affect. Normal behavior. Normal judgment and  thought content. CARDIOVASCULAR: Normal heart rate noted RESPIRATORY: Effort and breath sounds normal, no problems with respiration noted ABDOMEN: Soft, no distention noted.   PELVIC: Normal appearing external genitalia; normal appearing vaginal mucosa and cervix.  White discharge noted.  MUSCULOSKELETAL: Normal range of motion. No edema noted.  Labs and Imaging No results found.  Assessment & Plan:  1. Gonorrhea TOC done today, emphasized safe sex practices. - Cervicovaginal ancillary only  2. BV (bacterial vaginosis) Metrogel prescribed.  Proper vulvar hygiene emphasized: discussed avoidance of perfumed soaps, detergents, lotions and any type of douches; in addition to wearing cotton underwear and no underwear at night.  Also recommended cleaning front to back, voiding and cleaning up after intercourse. She is already practicing many of these recommendations. - Cervicovaginal ancillary only - metroNIDAZOLE (METROGEL) 0.75 % vaginal gel; Place 1 Applicatorful vaginally at bedtime. Apply one applicatorful to vagina at bedtime for 5 days  Dispense: 70 g; Refill: 2  3. Breakthrough bleeding on OCPs Emphasized importance of taking pills at the same time daily. Recommended LARCs, she declined. Suggested Xulane, discussed black-box warning about increased VTE risk but did emphasize that this is a rare risk and that the patch would help with compliance. She will do her research and let us know if she wants it.    Routine preventative health maintenance measures emphasized. Please refer to After Visit Summary for other counseling recommendations.   Return if symptoms worsen or fail to improve.   Total face-to-face time with patient: 15 minutes.  Over 50% of encounter was spent on counseling and coordination of care.  Verita Schneiders, MD, [place] Attending [place], [facility] for [employer], [place]

## 2017-09-16 NOTE — Progress Notes (Signed)
Patient desires to repeat GC./CHL and discuss periods. Armandina StammerJennifer Howard RN

## 2017-09-17 LAB — CERVICOVAGINAL ANCILLARY ONLY
Bacterial vaginitis: POSITIVE — AB
CANDIDA VAGINITIS: NEGATIVE
Chlamydia: NEGATIVE
Neisseria Gonorrhea: NEGATIVE
TRICH (WINDOWPATH): NEGATIVE

## 2017-10-18 ENCOUNTER — Encounter: Payer: Self-pay | Admitting: Medical

## 2017-10-18 ENCOUNTER — Ambulatory Visit (INDEPENDENT_AMBULATORY_CARE_PROVIDER_SITE_OTHER): Payer: Managed Care, Other (non HMO) | Admitting: Medical

## 2017-10-18 VITALS — BP 106/79 | HR 70 | Temp 97.6°F | Resp 16 | Ht 70.0 in | Wt 170.8 lb

## 2017-10-18 DIAGNOSIS — J339 Nasal polyp, unspecified: Secondary | ICD-10-CM

## 2017-10-18 DIAGNOSIS — R0981 Nasal congestion: Secondary | ICD-10-CM

## 2017-10-18 DIAGNOSIS — J0100 Acute maxillary sinusitis, unspecified: Secondary | ICD-10-CM

## 2017-10-18 MED ORDER — FLUTICASONE PROPIONATE 50 MCG/ACT NA SUSP: 2.0000 | Freq: Every day | NASAL | 1 refills | Status: DC

## 2017-10-18 MED ORDER — AMOXICILLIN-POT CLAVULANATE 875-125 MG PO TABS: 1.0000 | ORAL_TABLET | Freq: Two times a day (BID) | ORAL | 0 refills | Status: DC

## 2017-10-18 MED ORDER — METHYLPREDNISOLONE ACETATE 40 MG/ML IJ SUSP
40.0000 mg | Freq: Once | INTRAMUSCULAR | Status: AC
  Administered 2017-10-18: 40 mg via INTRAMUSCULAR

## 2017-10-18 MED ORDER — BENZONATATE 100 MG PO CAPS: 100.0000 mg | ORAL_CAPSULE | Freq: Three times a day (TID) | ORAL | 0 refills | Status: DC | PRN

## 2017-10-18 MED FILL — BENZONATATE 100 MG CAPS: 100 | 7 days supply | Qty: 21 | Fill #0

## 2017-10-18 MED FILL — FLUTICASONE PROP 50 MCG SPR: 50 | 30 days supply | Qty: 16 | Fill #0

## 2017-10-18 MED FILL — AMOX-CLAV 875-125 MG TABLET: 875-125 | 10 days supply | Qty: 20 | Fill #0

## 2017-10-18 NOTE — Patient Instructions (Addendum)
You appear to have a sinus infection with nasal polyp recurrence and rebound nasal congestion post afrin use.. I am prescribing  augmentin antibiotic for the infection. To help with the nasal congestion I prescribed nasal steroid flonase. For your associated cough, I prescribed cough medicine benzonatate.  With severe congestion I do think depomedrol 40 mg im injection would be beneficial.  Also with polyp recurrent will refer you back to former ENT  Rest, hydrate, tylenol for fever.  Reminder no further afrin. Educated on rebound nasal congestion.   Follow up in 7 days or as needed.

## 2017-10-18 NOTE — Progress Notes (Signed)
Subjective:    Patient ID: Stacey Wyatt, female    DOB: 05/04/1996, 22 y.o.   MRN: [id number]  HPI  Pt in states nasal congested. She states very congested. Difficult to smell and taste. Pt has been using afrin daily for a month or so.  Pt states history of nasal polyps in past and had surgery for those in the past. Told might come back. She states she thinks rt side polyp came back.  Pt has been blowing out colored mucus from her nose. Some fever, and sweats last week. Sinus region pain with the above as well.   Pt on menses presently.      Review of Systems  Constitutional: Negative for chills, fatigue and fever.  HENT: Positive for congestion, ear pain, facial swelling, sinus pressure and sinus pain. Negative for sore throat.   Respiratory: Positive for cough. Negative for chest tightness, shortness of breath and wheezing.   Cardiovascular: Negative for chest pain and palpitations.  Gastrointestinal: Negative for abdominal distention and anal bleeding.  Musculoskeletal: Negative for back pain and neck pain.  Skin: Negative for rash.  Neurological: Negative for dizziness, syncope, speech difficulty, weakness and headaches.  Hematological: Negative for adenopathy. Does not bruise/bleed easily.  Psychiatric/Behavioral: Negative for agitation, behavioral problems and confusion. The patient is not nervous/anxious.     Past Medical History:  Diagnosis Date  . Allergy   . Asthma   . Seasonal allergies   . Syncope      Social History   Socioeconomic History  . Marital status: Single    Spouse name: Not on file  . Number of children: Not on file  . Years of education: Not on file  . Highest education level: Not on file  Social Needs  . Financial resource strain: Not on file  . Food insecurity - worry: Not on file  . Food insecurity - inability: Not on file  . Transportation needs - medical: Not on file  . Transportation needs - non-medical: Not on file    Occupational History  . Not on file  Tobacco Use  . Smoking status: Never Smoker  . Smokeless tobacco: Never Used  Substance and Sexual Activity  . Alcohol use: No    Alcohol/week: 0.0 oz  . Drug use: No  . Sexual activity: Yes    Birth control/protection: None  Other Topics Concern  . Not on file  Social History Narrative  . Not on file    Past Surgical History:  Procedure Laterality Date  . NASAL POLYP SURGERY Bilateral     Family History  Problem Relation Age of Onset  . Hypertension Mother   . Allergic rhinitis Father     No Known Allergies  Current Outpatient Medications on File Prior to Visit  Medication Sig Dispense Refill  . albuterol (PROVENTIL HFA;VENTOLIN HFA) 108 (90 Base) MCG/ACT inhaler Inhale 2 puffs into the lungs every 6 (six) hours as needed for wheezing or shortness of breath. 18 g 5  . diclofenac (VOLTAREN) 75 MG EC tablet Take 1 tablet (75 mg total) by mouth 2 (two) times daily. 30 tablet 0  . metroNIDAZOLE (FLAGYL) 500 MG tablet Take 1 tablet (500 mg total) 2 (two) times daily by mouth. 14 tablet 0  . metroNIDAZOLE (METROGEL) 0.75 % vaginal gel Place 1 Applicatorful vaginally at bedtime. Apply one applicatorful to vagina at bedtime for 5 days 70 g 2  . norgestrel-ethinyl estradiol (LO/OVRAL,CRYSELLE) 0.3-30 MG-MCG tablet Take 1 tablet by mouth daily. 1 Package  11   No current facility-administered medications on file prior to visit.     BP 106/79   Pulse 70   Temp 97.6 F (36.4 C) (Oral)   Resp 16   Ht 5\' 10"  (1.778 m)   Wt 170 lb 12.8 oz (77.5 kg)   SpO2 100%   BMI 24.51 kg/m       Objective:   Physical Exam  General  Mental Status - Alert. General Appearance - Well groomed. Not in acute distress. Sounds very nasal congested.  Skin Rashes- No Rashes.  HEENT Head- Normal. Ear Auditory Canal - Left- Normal. Right - Normal.Tympanic Membrane- Left- Normal. Right- Normal. Eye Sclera/Conjunctiva- Left- Normal. Right- Normal. Nose  & Sinuses Nasal Mucosa- Left-  Boggy and Congested. Right-  Boggy and  Congested(both side polyps present but rt side polyp larger..Bilateral maxillary and frontal sinus pressure. Mouth & Throat Lips: Upper Lip- Normal: no dryness, cracking, pallor, cyanosis, or vesicular eruption. Lower Lip-Normal: no dryness, cracking, pallor, cyanosis or vesicular eruption. Buccal Mucosa- Bilateral- No Aphthous ulcers. Oropharynx- No Discharge or Erythema. Tonsils: Characteristics- Bilateral- No Erythema or Congestion. Size/Enlargement- Bilateral- No enlargement. Discharge- bilateral-None.  Neck Neck- Supple. No Masses.   Chest and Lung Exam Auscultation: Breath Sounds:-Clear even and unlabored.  Cardiovascular Auscultation:Rythm- Regular, rate and rhythm. Murmurs & Other Heart Sounds:Ausculatation of the heart reveal- No Murmurs.  Lymphatic Head & Neck General Head & Neck Lymphatics: Bilateral: Description- No Localized lymphadenopathy.      Assessment & Plan:  You appear to have a sinus infection with nasal polyp recurrence and rebound nasal congestion post afrin use.. I am prescribing  augmentin antibiotic for the infection. To help with the nasal congestion I prescribed nasal steroid flonase. For your associated cough, I prescribed cough medicine benzonatate.  With severe congestion I do think depomedrol 40 mg im injection would be beneficial.  Also with polyp recurrent will refer you back to former ENT  Rest, hydrate, tylenol for fever.  Reminder no further afrin. Educated on rebound nasal congestion.   Follow up in 7 days or as needed.

## 2017-11-26 ENCOUNTER — Encounter: Payer: Self-pay | Admitting: Medical

## 2017-11-29 ENCOUNTER — Telehealth: Payer: Self-pay | Admitting: Medical

## 2017-11-29 DIAGNOSIS — N760 Acute vaginitis: Principal | ICD-10-CM

## 2017-11-29 DIAGNOSIS — B9689 Other specified bacterial agents as the cause of diseases classified elsewhere: Secondary | ICD-10-CM

## 2017-11-29 DIAGNOSIS — J339 Nasal polyp, unspecified: Secondary | ICD-10-CM

## 2017-11-29 MED ORDER — METRONIDAZOLE 0.75 % VA GEL: 1.0000 | Freq: Every day | VAGINAL | 2 refills | Status: DC

## 2017-11-29 MED ORDER — FLUTICASONE PROPIONATE 50 MCG/ACT NA SUSP: 2.0000 | Freq: Every day | NASAL | 1 refills | Status: DC

## 2017-11-29 NOTE — Telephone Encounter (Signed)
Prescription of Flonase and Flagyl gel sent to patient's pharmacy.

## 2017-11-29 NOTE — Telephone Encounter (Signed)
Place referral to ENT for patient.  Would you please get that in progress.  The referral note mentions coordinating referral with patient.  I want her to see prior ENT which she saw in the past.  Mentions previous surgery.  So would you please call patient and get the name of that ENT.

## 2017-12-14 ENCOUNTER — Telehealth: Payer: Self-pay | Admitting: Medical

## 2017-12-14 ENCOUNTER — Encounter: Payer: Self-pay | Admitting: Medical

## 2017-12-14 MED ORDER — FLUTICASONE PROPIONATE 50 MCG/ACT NA SUSP
2.0000 | Freq: Every day | NASAL | 1 refills | Status: DC
Start: 2017-12-14 — End: 2017-12-17

## 2017-12-14 NOTE — Telephone Encounter (Signed)
Sent in flonase to pt pharmacy at pt request.

## 2017-12-16 ENCOUNTER — Telehealth: Payer: Self-pay | Admitting: Medical

## 2017-12-16 NOTE — Telephone Encounter (Signed)
Open to review.  

## 2017-12-17 ENCOUNTER — Telehealth: Payer: Self-pay | Admitting: Medical

## 2017-12-17 MED ORDER — FLUTICASONE PROPIONATE 50 MCG/ACT NA SUSP: 2.0000 | Freq: Every day | NASAL | 0 refills | Status: DC

## 2017-12-17 NOTE — Telephone Encounter (Signed)
I tried to call pharmacy and it took 20-30 minutes but no answer. Unsuccesful to inquire which med they won't fill?

## 2017-12-17 NOTE — Telephone Encounter (Signed)
Open to send prescription of Flonase to patient's pharmacy.

## 2017-12-17 NOTE — Telephone Encounter (Signed)
Sent a new prescription of Flonase to patient's pharmacy.

## 2017-12-22 ENCOUNTER — Ambulatory Visit: Payer: Managed Care, Other (non HMO) | Admitting: Obstetrics & Gynecology

## 2018-02-08 ENCOUNTER — Encounter: Payer: Self-pay | Admitting: Medical

## 2018-02-14 ENCOUNTER — Ambulatory Visit: Payer: Managed Care, Other (non HMO) | Admitting: Medical

## 2018-02-14 DIAGNOSIS — Z0289 Encounter for other administrative examinations: Secondary | ICD-10-CM

## 2018-02-15 ENCOUNTER — Ambulatory Visit: Payer: Managed Care, Other (non HMO) | Admitting: Medical

## 2018-03-02 ENCOUNTER — Encounter: Payer: Self-pay | Admitting: Obstetrics & Gynecology

## 2018-03-04 ENCOUNTER — Other Ambulatory Visit: Payer: Self-pay

## 2018-03-04 ENCOUNTER — Encounter (HOSPITAL_BASED_OUTPATIENT_CLINIC_OR_DEPARTMENT_OTHER): Payer: Self-pay | Admitting: Emergency Medicine

## 2018-03-04 ENCOUNTER — Emergency Department (HOSPITAL_BASED_OUTPATIENT_CLINIC_OR_DEPARTMENT_OTHER)
Admission: EM | Admit: 2018-03-04 | Discharge: 2018-03-04 | Disposition: A | Discharge status: Home / Self Care | Payer: Managed Care, Other (non HMO) | Attending: Emergency Medicine | Admitting: Emergency Medicine

## 2018-03-04 ENCOUNTER — Ambulatory Visit: Payer: Managed Care, Other (non HMO)

## 2018-03-04 DIAGNOSIS — R05 Cough: Secondary | ICD-10-CM | POA: Insufficient documentation

## 2018-03-04 DIAGNOSIS — Z3A01 Less than 8 weeks gestation of pregnancy: Secondary | ICD-10-CM | POA: Diagnosis not present

## 2018-03-04 DIAGNOSIS — H9202 Otalgia, left ear: Secondary | ICD-10-CM | POA: Diagnosis present

## 2018-03-04 DIAGNOSIS — H6690 Otitis media, unspecified, unspecified ear: Secondary | ICD-10-CM

## 2018-03-04 DIAGNOSIS — O23591 Infection of other part of genital tract in pregnancy, first trimester: Secondary | ICD-10-CM | POA: Insufficient documentation

## 2018-03-04 DIAGNOSIS — O9989 Other specified diseases and conditions complicating pregnancy, childbirth and the puerperium: Secondary | ICD-10-CM | POA: Insufficient documentation

## 2018-03-04 DIAGNOSIS — O26891 Other specified pregnancy related conditions, first trimester: Secondary | ICD-10-CM | POA: Diagnosis not present

## 2018-03-04 DIAGNOSIS — Z79899 Other long term (current) drug therapy: Secondary | ICD-10-CM | POA: Diagnosis not present

## 2018-03-04 DIAGNOSIS — J45909 Unspecified asthma, uncomplicated: Secondary | ICD-10-CM | POA: Insufficient documentation

## 2018-03-04 DIAGNOSIS — B9689 Other specified bacterial agents as the cause of diseases classified elsewhere: Secondary | ICD-10-CM

## 2018-03-04 DIAGNOSIS — H6692 Otitis media, unspecified, left ear: Secondary | ICD-10-CM | POA: Insufficient documentation

## 2018-03-04 DIAGNOSIS — N760 Acute vaginitis: Secondary | ICD-10-CM

## 2018-03-04 DIAGNOSIS — N898 Other specified noninflammatory disorders of vagina: Secondary | ICD-10-CM

## 2018-03-04 LAB — WET PREP, GENITAL
SPERM: NONE SEEN
TRICH WET PREP: NONE SEEN
YEAST WET PREP: NONE SEEN

## 2018-03-04 LAB — PREGNANCY, URINE: Preg Test, Ur: POSITIVE — AB

## 2018-03-04 MED ORDER — AMOXICILLIN 500 MG PO CAPS: 500.0000 mg | ORAL_CAPSULE | Freq: Two times a day (BID) | ORAL | 0 refills | Status: AC

## 2018-03-04 MED ORDER — ACETAMINOPHEN 325 MG PO TABS
650.0000 mg | ORAL_TABLET | Freq: Once | ORAL | Status: AC
  Administered 2018-03-04: 650 mg via ORAL
  Filled 2018-03-04: qty 2

## 2018-03-04 MED ORDER — METRONIDAZOLE 500 MG PO TABS: 500.0000 mg | ORAL_TABLET | Freq: Two times a day (BID) | ORAL | 0 refills | Status: DC

## 2018-03-04 MED ORDER — AMOXICILLIN 500 MG PO CAPS
500.0000 mg | ORAL_CAPSULE | Freq: Once | ORAL | Status: AC
  Administered 2018-03-04: 500 mg via ORAL
  Filled 2018-03-04: qty 1

## 2018-03-04 MED FILL — metroNIDAZOLE 500 MG TABS: 500 | 7 days supply | Qty: 14 | Fill #0

## 2018-03-04 MED FILL — AMOXICILLIN 500 MG CAPSULE: 500 | 7 days supply | Qty: 14 | Fill #0

## 2018-03-04 NOTE — Discharge Instructions (Signed)
Your work-up and exam today showed evidence of otitis media (the ear infection), actual vaginosis, and early pregnancy.  Please call and follow-up with your OB/GYN for further prenatal management.  Please start taking prenatal vitamins.  Please use the antibiotics to treat both the BV and the ear infection.  If any symptoms change or worsen, please return to the nearest emergency department.  Please stay hydrated.

## 2018-03-04 NOTE — ED Triage Notes (Signed)
Pt states she has been having nasal congestion with left ear pain and headache since Sunday.  Denies fever or sob.

## 2018-03-04 NOTE — ED Provider Notes (Signed)
[facility] EMERGENCY DEPARTMENT Provider Note   CSN: [id number] Arrival date & time: 03/04/18  0759     History   Chief Complaint Chief Complaint  Patient presents with  . Otalgia  . Cough    HPI Stacey Wyatt is a 22 y.o. female.  The history is provided by the patient and medical records.  Vaginal Discharge   This is a recurrent problem. The current episode started more than 1 week ago. The problem occurs constantly. The problem has not changed since onset.The discharge was white. Pertinent negatives include no diaphoresis, no fever, no abdominal pain, no constipation, no diarrhea, no nausea, no vomiting, no dysuria and no frequency. She has tried nothing for the symptoms.  Otalgia  This is a new problem. The current episode started yesterday. There is pain in the left ear. The problem occurs constantly. The problem has not changed since onset.There has been no fever. The pain is severe. Associated symptoms include rhinorrhea. Pertinent negatives include no abdominal pain, no diarrhea, no vomiting, no neck pain, no cough and no rash.    Past Medical History:  Diagnosis Date  . Allergy   . Asthma   . Seasonal allergies   . Syncope     Patient Active Problem List   Diagnosis Date Noted  . Faintness 10/11/2015  . Asthma   . Seasonal allergies   . Allergy   . Syncope     Past Surgical History:  Procedure Laterality Date  . NASAL POLYP SURGERY Bilateral      OB History    Gravida  1   Para      Term      Preterm      AB  1   Living        SAB  1   TAB      Ectopic      Multiple      Live Births               Home Medications    Prior to Admission medications   Medication Sig Start Date End Date Taking? Authorizing Provider  albuterol (PROVENTIL HFA;VENTOLIN HFA) 108 (90 Base) MCG/ACT inhaler Inhale 2 puffs into the lungs every 6 (six) hours as needed for wheezing or shortness of breath. 07/28/17   Saguier, Ramon Dredge, PA-C    amoxicillin-clavulanate (AUGMENTIN) 875-125 MG tablet Take 1 tablet by mouth 2 (two) times daily. 10/18/17   Saguier, Ramon Dredge, PA-C  benzonatate (TESSALON) 100 MG capsule Take 1 capsule (100 mg total) by mouth 3 (three) times daily as needed for cough. 10/18/17   Saguier, Ramon Dredge, PA-C  diclofenac (VOLTAREN) 75 MG EC tablet Take 1 tablet (75 mg total) by mouth 2 (two) times daily. 07/23/17   Saguier, Ramon Dredge, PA-C  fluticasone (FLONASE) 50 MCG/ACT nasal spray Place 2 sprays into both nostrils daily. 12/17/17   Saguier, Ramon Dredge, PA-C  metroNIDAZOLE (FLAGYL) 500 MG tablet Take 1 tablet (500 mg total) 2 (two) times daily by mouth. 08/23/17   Degele, Kandra Nicolas, MD  metroNIDAZOLE (METROGEL) 0.75 % vaginal gel Place 1 Applicatorful vaginally at bedtime. Apply one applicatorful to vagina at bedtime for 5 days 11/29/17   Saguier, Ramon Dredge, PA-C  norgestrel-ethinyl estradiol (LO/OVRAL,CRYSELLE) 0.3-30 MG-MCG tablet Take 1 tablet by mouth daily. 07/16/17   Allie Bossier, MD    Family History Family History  Problem Relation Age of Onset  . Hypertension Mother   . Allergic rhinitis Father     Social History Social History  Tobacco Use  . Smoking status: Never Smoker  . Smokeless tobacco: Never Used  Substance Use Topics  . Alcohol use: No    Alcohol/week: 0.0 oz  . Drug use: No     Allergies   Patient has no known allergies.   Review of Systems Review of Systems  Constitutional: Negative for chills, diaphoresis, fatigue and fever.  HENT: Positive for congestion, ear pain and rhinorrhea. Negative for sinus pressure, tinnitus, trouble swallowing and voice change.   Eyes: Negative for visual disturbance.  Respiratory: Negative for cough, chest tightness and shortness of breath.   Cardiovascular: Negative for chest pain.  Gastrointestinal: Negative for abdominal pain, constipation, diarrhea, nausea and vomiting.  Genitourinary: Positive for vaginal discharge. Negative for dysuria, flank pain,  frequency and vaginal bleeding.  Musculoskeletal: Negative for back pain, neck pain and neck stiffness.  Skin: Negative for rash.  Psychiatric/Behavioral: Negative for agitation and confusion.  All other systems reviewed and are negative.    Physical Exam Updated Vital Signs BP (!) 132/93 (BP Location: Left Arm)   Pulse 92   Temp 98.8 F (37.1 C) (Oral)   Resp 16   Ht  (1.753 m)   Wt 73.9 kg (163 lb)   LMP 01/27/2018   SpO2 100%   BMI 24.07 kg/m   Physical Exam  Constitutional: She is oriented to person, place, and time. She appears well-developed and well-nourished. No distress.  HENT:  Head: Normocephalic and atraumatic.  Right Ear: Tympanic membrane normal.  Left Ear: Tympanic membrane is injected, erythematous and bulging. No hemotympanum.  Nose: Nose normal.  Mouth/Throat: Oropharynx is clear and moist. No oropharyngeal exudate.  Eyes: Pupils are equal, round, and reactive to light. Conjunctivae and EOM are normal.  Neck: Normal range of motion.  Cardiovascular: Normal rate and intact distal pulses.  No murmur heard. Pulmonary/Chest: Effort normal. No respiratory distress. She has no wheezes. She exhibits no tenderness.  Abdominal: She exhibits no distension. There is no tenderness.  Genitourinary: Uterus is not tender. Cervix exhibits discharge. Cervix exhibits no motion tenderness and no friability. Right adnexum displays no tenderness. Left adnexum displays no tenderness. No tenderness in the vagina. Vaginal discharge found.  Musculoskeletal: Normal range of motion. She exhibits no edema or tenderness.  Lymphadenopathy:    She has no cervical adenopathy.  Neurological: She is alert and oriented to person, place, and time. No sensory deficit. She exhibits normal muscle tone.  Skin: Skin is warm. Capillary refill takes less than 2 seconds. No rash noted. She is not diaphoretic. No erythema.  Nursing note and vitals reviewed.    ED Treatments / Results   Labs (all labs ordered are listed, but only abnormal results are displayed) Labs Reviewed  WET PREP, GENITAL - Abnormal; Notable for the following components:      Result Value   Clue Cells Wet Prep HPF POC PRESENT (*)    WBC, Wet Prep HPF POC MANY (*)    All other components within normal limits  PREGNANCY, URINE - Abnormal; Notable for the following components:   Preg Test, Ur POSITIVE (*)    All other components within normal limits  GC/CHLAMYDIA PROBE AMP ([place]) NOT AT [facility]  WET PREP  (BD AFFIRM) ()    EKG None  Radiology No results found.  Procedures Procedures (including critical care time)  Medications Ordered in ED Medications - No data to display   Initial Impression / Assessment and Plan / ED Course  I have reviewed  the triage vital signs and the nursing notes.  Pertinent labs & imaging results that were available during my care of the patient were reviewed by me and considered in my medical decision making (see chart for details).     Kelleigh Kaman is a 22 y.o. female with no significant past medical history who presents with left ear pain, rhinorrhea, congestion, vaginal discharge, and concern for missed menstrual cycle.  Patient reports that she had URI symptoms for the last few days but developed severe left ear pain today.  She is concerned about ear infection.  She denies congestion, cough, fevers, or chills.  She denies neck pain or neck stiffness.  She denies any vision changes or neurologic deficits.  She does report that she has had intermittent episodes of bacterial vaginosis since a miscarriage last year.  She was scheduled to see her doctor today for a pelvic swab for BV.  She denies any vaginal bleeding, pelvic pain, or abdominal pain.  She reports that her menstrual cycle was supposed to happen this past week but it had not happened.  On exam, patient has evidence of otitis media in the left ear.  Right ear unremarkable.  No mastoid  tenderness or sinus tenderness.  Lungs clear and chest nontender.  Abdomen was nontender.  Pelvic exam performed showing discharge with no bleeding.  No cervical motion tenderness or adnexal tenderness.  Exam otherwise unremarkable.  Pregnancy test was positive and swabs revealed BV.  Patient will be given prescription for antibiotics for BV.  Patient also given a dose of oral amoxicillin for otitis media and Tylenol for pain.  Patient denied any urinary symptoms, do not feel she needs urinalysis or other laboratory testing however patient will follow-up with her OB/GYN for further pregnancy monitoring.  Patient encouraged to take prenatal vitamins and establish care with OB.  Patient understood return precautions and follow-up instructions for both her ear infection and her BV.  Patient had no other questions or concerns and was discharged in good condition.  Final Clinical Impressions(s) / ED Diagnoses   Final diagnoses:  Acute otitis media, unspecified otitis media type  BV (bacterial vaginosis)  Less than [redacted] weeks gestation of pregnancy  Vaginal discharge during pregnancy in first trimester    ED Discharge Orders        Ordered    metroNIDAZOLE (FLAGYL) 500 MG tablet  2 times daily     03/04/18 1042    amoxicillin (AMOXIL) 500 MG capsule  2 times daily     03/04/18 1042      Clinical Impression: 1. Acute otitis media, unspecified otitis media type   2. BV (bacterial vaginosis)   3. Less than [redacted] weeks gestation of pregnancy   4. Vaginal discharge during pregnancy in first trimester     Disposition: Discharge  Condition: Good  I have discussed the results, Dx and Tx plan with the pt(& family if present). He/she/they expressed understanding and agree(s) with the plan. Discharge instructions discussed at great length. Strict return precautions discussed and pt &/or family have verbalized understanding of the instructions. No further questions at time of discharge.    Discharge  Medication List as of 03/04/2018 10:44 AM    START taking these medications   Details  amoxicillin (AMOXIL) 500 MG capsule Take 1 capsule (500 mg total) by mouth 2 (two) times daily for 7 days., Starting Fri 03/04/2018, Until Fri 03/11/2018, Print    !! metroNIDAZOLE (FLAGYL) 500 MG tablet Take 1 tablet (500 mg  total) by mouth 2 (two) times daily., Starting Fri 03/04/2018, Print     !! - Potential duplicate medications found. Please discuss with provider.      Follow Up: Marisue Brooklyn [address] [place] [address] [phone]     [facility] [address] [place] [address] [phone] Schedule an appointment as soon as possible for a visit    [facility] [address] [id number] [address] [place] [address] [phone]       Carlethia Mesquita, Canary Brim, MD 03/04/18 1145

## 2018-03-07 LAB — GC/CHLAMYDIA PROBE AMP (~~LOC~~) NOT AT ARMC
Chlamydia: NEGATIVE
NEISSERIA GONORRHEA: NEGATIVE

## 2018-03-16 ENCOUNTER — Ambulatory Visit (INDEPENDENT_AMBULATORY_CARE_PROVIDER_SITE_OTHER): Payer: Managed Care, Other (non HMO) | Admitting: Family Medicine

## 2018-03-16 ENCOUNTER — Other Ambulatory Visit (HOSPITAL_COMMUNITY)
Admission: RE | Admit: 2018-03-16 | Discharge: 2018-03-16 | Disposition: A | Discharge status: Home / Self Care | Payer: Managed Care, Other (non HMO) | Source: Ambulatory Visit | Attending: Obstetrics & Gynecology | Admitting: Obstetrics & Gynecology

## 2018-03-16 ENCOUNTER — Encounter: Payer: Self-pay | Admitting: Family Medicine

## 2018-03-16 DIAGNOSIS — Z3401 Encounter for supervision of normal first pregnancy, first trimester: Secondary | ICD-10-CM

## 2018-03-16 DIAGNOSIS — Z3687 Encounter for antenatal screening for uncertain dates: Secondary | ICD-10-CM

## 2018-03-16 DIAGNOSIS — Z3480 Encounter for supervision of other normal pregnancy, unspecified trimester: Secondary | ICD-10-CM | POA: Insufficient documentation

## 2018-03-16 DIAGNOSIS — Z3481 Encounter for supervision of other normal pregnancy, first trimester: Secondary | ICD-10-CM

## 2018-03-16 MED ORDER — PRENATE PIXIE 10-0.6-0.4-200 MG PO CAPS
1.0000 | ORAL_CAPSULE | Freq: Every day | ORAL | 12 refills | Status: DC
Start: 2018-03-16 — End: 2020-05-03

## 2018-03-16 MED FILL — PRENATE PIXIE SOFTGEL: 10-0.6-0.4- | 30 days supply | Qty: 30 | Fill #0 | Status: TO

## 2018-03-16 NOTE — Progress Notes (Signed)
DATING AND VIABILITY SONOGRAM   Stacey Wyatt is a 22 y.o. year old G2P0010 with LMP Patient's last menstrual period was 01/27/2018. which would correlate to  2627w6d weeks gestation.  She has regular menstrual cycles.   She is here today for a confirmatory initial sonogram.    GESTATION: SINGLETON    FETAL ACTIVITY:          Heart rate         154          The fetus is active.    ADNEXA: The ovaries are normal.   GESTATIONAL AGE AND  BIOMETRICS:  Gestational criteria: Estimated Date of Delivery: 11/03/18 by LMP now at 8627w6d  Previous Scans:0  GESTATIONAL SAC        cm        6-2weeks  CROWN RUMP LENGTH           0.616 cm         6-3 weeks                                                                               AVERAGE EGA(BY THIS SCAN): 6-3 weeks  WORKING EDD( LMP ):  11-03-2018     Armandina StammerJennifer Pocahontas Cohenour 03/16/2018 4:27 PM

## 2018-03-16 NOTE — Patient Instructions (Signed)
 First Trimester of Pregnancy The first trimester of pregnancy is from week 1 until the end of week 13 (months 1 through 3). A week after a sperm fertilizes an egg, the egg will implant on the wall of the uterus. This embryo will begin to develop into a baby. Genes from you and your partner will form the baby. The female genes will determine whether the baby will be a boy or a girl. At 6-8 weeks, the eyes and face will be formed, and the heartbeat can be seen on ultrasound. At the end of 12 weeks, all the baby's organs will be formed. Now that you are pregnant, you will want to do everything you can to have a healthy baby. Two of the most important things are to get good prenatal care and to follow your health care provider's instructions. Prenatal care is all the medical care you receive before the baby's birth. This care will help prevent, find, and treat any problems during the pregnancy and childbirth. Body changes during your first trimester Your body goes through many changes during pregnancy. The changes vary from woman to woman.  You may gain or lose a couple of pounds at first.  You may feel sick to your stomach (nauseous) and you may throw up (vomit). If the vomiting is uncontrollable, call your health care provider.  You may tire easily.  You may develop headaches that can be relieved by medicines. All medicines should be approved by your health care provider.  You may urinate more often. Painful urination may mean you have a bladder infection.  You may develop heartburn as a result of your pregnancy.  You may develop constipation because certain hormones are causing the muscles that push stool through your intestines to slow down.  You may develop hemorrhoids or swollen veins (varicose veins).  Your breasts may begin to grow larger and become tender. Your nipples may stick out more, and the tissue that surrounds them (areola) may become darker.  Your gums may bleed and may be  sensitive to brushing and flossing.  Dark spots or blotches (chloasma, mask of pregnancy) may develop on your face. This will likely fade after the baby is born.  Your menstrual periods will stop.  You may have a loss of appetite.  You may develop cravings for certain kinds of food.  You may have changes in your emotions from day to day, such as being excited to be pregnant or being concerned that something may go wrong with the pregnancy and baby.  You may have more vivid and strange dreams.  You may have changes in your hair. These can include thickening of your hair, rapid growth, and changes in texture. Some women also have hair loss during or after pregnancy, or hair that feels dry or thin. Your hair will most likely return to normal after your baby is born.  What to expect at prenatal visits During a routine prenatal visit:  You will be weighed to make sure you and the baby are growing normally.  Your blood pressure will be taken.  Your abdomen will be measured to track your baby's growth.  The fetal heartbeat will be listened to between weeks 10 and 14 of your pregnancy.  Test results from any previous visits will be discussed.  Your health care provider may ask you:  How you are feeling.  If you are feeling the baby move.  If you have had any abnormal symptoms, such as leaking fluid, bleeding, severe   headaches, or abdominal cramping.  If you are using any tobacco products, including cigarettes, chewing tobacco, and electronic cigarettes.  If you have any questions.  Other tests that may be performed during your first trimester include:  Blood tests to find your blood type and to check for the presence of any previous infections. The tests will also be used to check for low iron levels (anemia) and protein on red blood cells (Rh antibodies). Depending on your risk factors, or if you previously had diabetes during pregnancy, you may have tests to check for high blood  sugar that affects pregnant women (gestational diabetes).  Urine tests to check for infections, diabetes, or protein in the urine.  An ultrasound to confirm the proper growth and development of the baby.  Fetal screens for spinal cord problems (spina bifida) and Down syndrome.  HIV (human immunodeficiency virus) testing. Routine prenatal testing includes screening for HIV, unless you choose not to have this test.  You may need other tests to make sure you and the baby are doing well.  Follow these instructions at home: Medicines  Follow your health care provider's instructions regarding medicine use. Specific medicines may be either safe or unsafe to take during pregnancy.  Take a prenatal vitamin that contains at least 600 micrograms (mcg) of folic acid.  If you develop constipation, try taking a stool softener if your health care provider approves. Eating and drinking  Eat a balanced diet that includes fresh fruits and vegetables, whole grains, good sources of protein such as meat, eggs, or tofu, and low-fat dairy. Your health care provider will help you determine the amount of weight gain that is right for you.  Avoid raw meat and uncooked cheese. These carry germs that can cause birth defects in the baby.  Eating four or five small meals rather than three large meals a day may help relieve nausea and vomiting. If you start to feel nauseous, eating a few soda crackers can be helpful. Drinking liquids between meals, instead of during meals, also seems to help ease nausea and vomiting.  Limit foods that are high in fat and processed sugars, such as fried and sweet foods.  To prevent constipation: ? Eat foods that are high in fiber, such as fresh fruits and vegetables, whole grains, and beans. ? Drink enough fluid to keep your urine clear or pale yellow. Activity  Exercise only as directed by your health care provider. Most women can continue their usual exercise routine during  pregnancy. Try to exercise for 30 minutes at least 5 days a week. Exercising will help you: ? Control your weight. ? Stay in shape. ? Be prepared for labor and delivery.  Experiencing pain or cramping in the lower abdomen or lower back is a good sign that you should stop exercising. Check with your health care provider before continuing with normal exercises.  Try to avoid standing for long periods of time. Move your legs often if you must stand in one place for a long time.  Avoid heavy lifting.  Wear low-heeled shoes and practice good posture.  You may continue to have sex unless your health care provider tells you not to. Relieving pain and discomfort  Wear a good support bra to relieve breast tenderness.  Take warm sitz baths to soothe any pain or discomfort caused by hemorrhoids. Use hemorrhoid cream if your health care provider approves.  Rest with your legs elevated if you have leg cramps or low back pain.  If you   develop varicose veins in your legs, wear support hose. Elevate your feet for 15 minutes, 3-4 times a day. Limit salt in your diet. Prenatal care  Schedule your prenatal visits by the twelfth week of pregnancy. They are usually scheduled monthly at first, then more often in the last 2 months before delivery.  Write down your questions. Take them to your prenatal visits.  Keep all your prenatal visits as told by your health care provider. This is important. Safety  Wear your seat belt at all times when driving.  Make a list of emergency phone numbers, including numbers for family, friends, the hospital, and police and fire departments. General instructions  Ask your health care provider for a referral to a local prenatal education class. Begin classes no later than the beginning of month 6 of your pregnancy.  Ask for help if you have counseling or nutritional needs during pregnancy. Your health care provider can offer advice or refer you to specialists for help  with various needs.  Do not use hot tubs, steam rooms, or saunas.  Do not douche or use tampons or scented sanitary pads.  Do not cross your legs for long periods of time.  Avoid cat litter boxes and soil used by cats. These carry germs that can cause birth defects in the baby and possibly loss of the fetus by miscarriage or stillbirth.  Avoid all smoking, herbs, alcohol, and medicines not prescribed by your health care provider. Chemicals in these products affect the formation and growth of the baby.  Do not use any products that contain nicotine or tobacco, such as cigarettes and e-cigarettes. If you need help quitting, ask your health care provider. You may receive counseling support and other resources to help you quit.  Schedule a dentist appointment. At home, brush your teeth with a soft toothbrush and be gentle when you floss. Contact a health care provider if:  You have dizziness.  You have mild pelvic cramps, pelvic pressure, or nagging pain in the abdominal area.  You have persistent nausea, vomiting, or diarrhea.  You have a bad smelling vaginal discharge.  You have pain when you urinate.  You notice increased swelling in your face, hands, legs, or ankles.  You are exposed to fifth disease or chickenpox.  You are exposed to German measles (rubella) and have never had it. Get help right away if:  You have a fever.  You are leaking fluid from your vagina.  You have spotting or bleeding from your vagina.  You have severe abdominal cramping or pain.  You have rapid weight gain or loss.  You vomit blood or material that looks like coffee grounds.  You develop a severe headache.  You have shortness of breath.  You have any kind of trauma, such as from a fall or a car accident. Summary  The first trimester of pregnancy is from week 1 until the end of week 13 (months 1 through 3).  Your body goes through many changes during pregnancy. The changes vary from  woman to woman.  You will have routine prenatal visits. During those visits, your health care provider will examine you, discuss any test results you may have, and talk with you about how you are feeling. This information is not intended to replace advice given to you by your health care provider. Make sure you discuss any questions you have with your health care provider. Document Released: 09/15/2001 Document Revised: 09/02/2016 Document Reviewed: 09/02/2016 [employer] Interactive Patient Education  2018   [employer]   Breastfeeding Choosing to breastfeed is one of the best decisions you can make for yourself and your baby. A change in hormones during pregnancy causes your breasts to make breast milk in your milk-producing glands. Hormones prevent breast milk from being released before your baby is born. They also prompt milk flow after birth. Once breastfeeding has begun, thoughts of your baby, as well as his or her sucking or crying, can stimulate the release of milk from your milk-producing glands. Benefits of breastfeeding Research shows that breastfeeding offers many health benefits for infants and mothers. It also offers a cost-free and convenient way to feed your baby. For your baby  Your first milk (colostrum) helps your baby's digestive system to function better.  Special cells in your milk (antibodies) help your baby to fight off infections.  Breastfed babies are less likely to develop asthma, allergies, obesity, or type 2 diabetes. They are also at lower risk for sudden infant death syndrome (SIDS).  Nutrients in breast milk are better able to meet your baby's needs compared to infant formula.  Breast milk improves your baby's brain development. For you  Breastfeeding helps to create a very special bond between you and your baby.  Breastfeeding is convenient. Breast milk costs nothing and is always available at the correct temperature.  Breastfeeding helps to burn calories.  It helps you to lose the weight that you gained during pregnancy.  Breastfeeding makes your uterus return faster to its size before pregnancy. It also slows bleeding (lochia) after you give birth.  Breastfeeding helps to lower your risk of developing type 2 diabetes, osteoporosis, rheumatoid arthritis, cardiovascular disease, and breast, ovarian, uterine, and endometrial cancer later in life. Breastfeeding basics Starting breastfeeding  Find a comfortable place to sit or lie down, with your neck and back well-supported.  Place a pillow or a rolled-up blanket under your baby to bring him or her to the level of your breast (if you are seated). Nursing pillows are specially designed to help support your arms and your baby while you breastfeed.  Make sure that your baby's tummy (abdomen) is facing your abdomen.  Gently massage your breast. With your fingertips, massage from the outer edges of your breast inward toward the nipple. This encourages milk flow. If your milk flows slowly, you may need to continue this action during the feeding.  Support your breast with 4 fingers underneath and your thumb above your nipple (make the letter "C" with your hand). Make sure your fingers are well away from your nipple and your baby's mouth.  Stroke your baby's lips gently with your finger or nipple.  When your baby's mouth is open wide enough, quickly bring your baby to your breast, placing your entire nipple and as much of the areola as possible into your baby's mouth. The areola is the colored area around your nipple. ? More areola should be visible above your baby's upper lip than below the lower lip. ? Your baby's lips should be opened and extended outward (flanged) to ensure an adequate, comfortable latch. ? Your baby's tongue should be between his or her lower gum and your breast.  Make sure that your baby's mouth is correctly positioned around your nipple (latched). Your baby's lips should create a  seal on your breast and be turned out (everted).  It is common for your baby to suck about 2-3 minutes in order to start the flow of breast milk. Latching Teaching your baby how to latch onto   your breast properly is very important. An improper latch can cause nipple pain, decreased milk supply, and poor weight gain in your baby. Also, if your baby is not latched onto your nipple properly, he or she may swallow some air during feeding. This can make your baby fussy. Burping your baby when you switch breasts during the feeding can help to get rid of the air. However, teaching your baby to latch on properly is still the best way to prevent fussiness from swallowing air while breastfeeding. Signs that your baby has successfully latched onto your nipple  Silent tugging or silent sucking, without causing you pain. Infant's lips should be extended outward (flanged).  Swallowing heard between every 3-4 sucks once your milk has started to flow (after your let-down milk reflex occurs).  Muscle movement above and in front of his or her ears while sucking.  Signs that your baby has not successfully latched onto your nipple  Sucking sounds or smacking sounds from your baby while breastfeeding.  Nipple pain.  If you think your baby has not latched on correctly, slip your finger into the corner of your baby's mouth to break the suction and place it between your baby's gums. Attempt to start breastfeeding again. Signs of successful breastfeeding Signs from your baby  Your baby will gradually decrease the number of sucks or will completely stop sucking.  Your baby will fall asleep.  Your baby's body will relax.  Your baby will retain a small amount of milk in his or her mouth.  Your baby will let go of your breast by himself or herself.  Signs from you  Breasts that have increased in firmness, weight, and size 1-3 hours after feeding.  Breasts that are softer immediately after  breastfeeding.  Increased milk volume, as well as a change in milk consistency and color by the fifth day of breastfeeding.  Nipples that are not sore, cracked, or bleeding.  Signs that your baby is getting enough milk  Wetting at least 1-2 diapers during the first 24 hours after birth.  Wetting at least 5-6 diapers every 24 hours for the first week after birth. The urine should be clear or pale yellow by the age of 5 days.  Wetting 6-8 diapers every 24 hours as your baby continues to grow and develop.  At least 3 stools in a 24-hour period by the age of 5 days. The stool should be soft and yellow.  At least 3 stools in a 24-hour period by the age of 7 days. The stool should be seedy and yellow.  No loss of weight greater than 10% of birth weight during the first 3 days of life.  Average weight gain of 4-7 oz (113-198 g) per week after the age of 4 days.  Consistent daily weight gain by the age of 5 days, without weight loss after the age of 2 weeks. After a feeding, your baby may spit up a small amount of milk. This is normal. Breastfeeding frequency and duration Frequent feeding will help you make more milk and can prevent sore nipples and extremely full breasts (breast engorgement). Breastfeed when you feel the need to reduce the fullness of your breasts or when your baby shows signs of hunger. This is called "breastfeeding on demand." Signs that your baby is hungry include:  Increased alertness, activity, or restlessness.  Movement of the head from side to side.  Opening of the mouth when the corner of the mouth or cheek is stroked (rooting).    Increased sucking sounds, smacking lips, cooing, sighing, or squeaking.  Hand-to-mouth movements and sucking on fingers or hands.  Fussing or crying.  Avoid introducing a pacifier to your baby in the first 4-6 weeks after your baby is born. After this time, you may choose to use a pacifier. Research has shown that pacifier use during  the first year of a baby's life decreases the risk of sudden infant death syndrome (SIDS). Allow your baby to feed on each breast as long as he or she wants. When your baby unlatches or falls asleep while feeding from the first breast, offer the second breast. Because newborns are often sleepy in the first few weeks of life, you may need to awaken your baby to get him or her to feed. Breastfeeding times will vary from baby to baby. However, the following rules can serve as a guide to help you make sure that your baby is properly fed:  Newborns (babies 4 weeks of age or younger) may breastfeed every 1-3 hours.  Newborns should not go without breastfeeding for longer than 3 hours during the day or 5 hours during the night.  You should breastfeed your baby a minimum of 8 times in a 24-hour period.  Breast milk pumping Pumping and storing breast milk allows you to make sure that your baby is exclusively fed your breast milk, even at times when you are unable to breastfeed. This is especially important if you go back to work while you are still breastfeeding, or if you are not able to be present during feedings. Your lactation consultant can help you find a method of pumping that works best for you and give you guidelines about how long it is safe to store breast milk. Caring for your breasts while you breastfeed Nipples can become dry, cracked, and sore while breastfeeding. The following recommendations can help keep your breasts moisturized and healthy:  Avoid using soap on your nipples.  Wear a supportive bra designed especially for nursing. Avoid wearing underwire-style bras or extremely tight bras (sports bras).  Air-dry your nipples for 3-4 minutes after each feeding.  Use only cotton bra pads to absorb leaked breast milk. Leaking of breast milk between feedings is normal.  Use lanolin on your nipples after breastfeeding. Lanolin helps to maintain your skin's normal moisture barrier. Pure  lanolin is not harmful (not toxic) to your baby. You may also hand express a few drops of breast milk and gently massage that milk into your nipples and allow the milk to air-dry.  In the first few weeks after giving birth, some women experience breast engorgement. Engorgement can make your breasts feel heavy, warm, and tender to the touch. Engorgement peaks within 3-5 days after you give birth. The following recommendations can help to ease engorgement:  Completely empty your breasts while breastfeeding or pumping. You may want to start by applying warm, moist heat (in the shower or with warm, water-soaked hand towels) just before feeding or pumping. This increases circulation and helps the milk flow. If your baby does not completely empty your breasts while breastfeeding, pump any extra milk after he or she is finished.  Apply ice packs to your breasts immediately after breastfeeding or pumping, unless this is too uncomfortable for you. To do this: ? Put ice in a plastic bag. ? Place a towel between your skin and the bag. ? Leave the ice on for 20 minutes, 2-3 times a day.  Make sure that your baby is latched on and   positioned properly while breastfeeding.  If engorgement persists after 48 hours of following these recommendations, contact your health care provider or a lactation consultant. Overall health care recommendations while breastfeeding  Eat 3 healthy meals and 3 snacks every day. Well-nourished mothers who are breastfeeding need an additional 450-500 calories a day. You can meet this requirement by increasing the amount of a balanced diet that you eat.  Drink enough water to keep your urine pale yellow or clear.  Rest often, relax, and continue to take your prenatal vitamins to prevent fatigue, stress, and low vitamin and mineral levels in your body (nutrient deficiencies).  Do not use any products that contain nicotine or tobacco, such as cigarettes and e-cigarettes. Your baby may  be harmed by chemicals from cigarettes that pass into breast milk and exposure to secondhand smoke. If you need help quitting, ask your health care provider.  Avoid alcohol.  Do not use illegal drugs or marijuana.  Talk with your health care provider before taking any medicines. These include over-the-counter and prescription medicines as well as vitamins and herbal supplements. Some medicines that may be harmful to your baby can pass through breast milk.  It is possible to become pregnant while breastfeeding. If birth control is desired, ask your health care provider about options that will be safe while breastfeeding your baby. Where to find more information: [employer]: www.llli.org Contact a health care provider if:  You feel like you want to stop breastfeeding or have become frustrated with breastfeeding.  Your nipples are cracked or bleeding.  Your breasts are red, tender, or warm.  You have: ? Painful breasts or nipples. ? A swollen area on either breast. ? A fever or chills. ? Nausea or vomiting. ? Drainage other than breast milk from your nipples.  Your breasts do not become full before feedings by the fifth day after you give birth.  You feel sad and depressed.  Your baby is: ? Too sleepy to eat well. ? Having trouble sleeping. ? More than 1 week old and wetting fewer than 6 diapers in a 24-hour period. ? Not gaining weight by 5 days of age.  Your baby has fewer than 3 stools in a 24-hour period.  Your baby's skin or the white parts of his or her eyes become yellow. Get help right away if:  Your baby is overly tired (lethargic) and does not want to wake up and feed.  Your baby develops an unexplained fever. Summary  Breastfeeding offers many health benefits for infant and mothers.  Try to breastfeed your infant when he or she shows early signs of hunger.  Gently tickle or stroke your baby's lips with your finger or nipple to allow the  baby to open his or her mouth. Bring the baby to your breast. Make sure that much of the areola is in your baby's mouth. Offer one side and burp the baby before you offer the other side.  Talk with your health care provider or lactation consultant if you have questions or you face problems as you breastfeed. This information is not intended to replace advice given to you by your health care provider. Make sure you discuss any questions you have with your health care provider. Document Released: 09/21/2005 Document Revised: 10/23/2016 Document Reviewed: 10/23/2016 [employer] Interactive Patient Education  2018 [employer]  

## 2018-03-17 LAB — OBSTETRIC PANEL, INCLUDING HIV
HIV Screen 4th Generation wRfx: NONREACTIVE
Hepatitis B Surface Ag: NEGATIVE
RPR Ser Ql: NONREACTIVE
Rubella Antibodies, IGG: 12.9 index (ref >0.99)

## 2018-03-17 LAB — SMN1 COPY NUMBER ANALYSIS (SMA CARRIER SCREENING)

## 2018-03-17 LAB — HEMOGLOBINOPATHY EVALUATION: Ferritin: 42 ng/mL (ref 15–150)

## 2018-03-17 LAB — SPECIMEN STATUS REPORT

## 2018-03-17 NOTE — Progress Notes (Signed)
Subjective:   Stacey Wyatt is a 22 y.o. G2P0010 at 3465w0d by LMP, early ultrasound being seen today for her first obstetrical visit.  Her obstetrical history is not significant. Patient does intend to breast feed. Pregnancy history fully reviewed.  Patient reports nausea and vomiting.  HISTORY: OB History  Gravida Para Term Preterm AB Living  2 0 0 0 1 0  SAB TAB Ectopic Multiple Live Births  1 0 0 0 0    # Outcome Date GA Lbr Len/2nd Weight Sex Delivery Anes PTL Lv  2 Current           1 SAB 12/21/16 4939w1d          Last pap smear was  07/2017 and was normal Past Medical History:  Diagnosis Date  . Allergy   . Asthma   . Seasonal allergies   . Syncope    Past Surgical History:  Procedure Laterality Date  . NASAL POLYP SURGERY Bilateral    Family History  Problem Relation Age of Onset  . Hypertension Mother   . Allergic rhinitis Father    Social History   Tobacco Use  . Smoking status: Never Smoker  . Smokeless tobacco: Never Used  Substance Use Topics  . Alcohol use: No    Alcohol/week: 0.0 oz  . Drug use: No   No Known Allergies Current Outpatient Medications on File Prior to Visit  Medication Sig Dispense Refill  . albuterol (PROVENTIL HFA;VENTOLIN HFA) 108 (90 Base) MCG/ACT inhaler Inhale 2 puffs into the lungs every 6 (six) hours as needed for wheezing or shortness of breath. 18 g 5  . fluticasone (FLONASE) 50 MCG/ACT nasal spray Place 2 sprays into both nostrils daily. 48 g 0   No current facility-administered medications on file prior to visit.      Exam   Vitals:   03/16/18 1607  BP: 113/79  Pulse: 77  Weight: 172 lb 6.4 oz (78.2 kg)   Fetal Heart Rate (bpm): 154  Uterus:     Pelvic Exam: Perineum: no hemorrhoids, normal perineum   Vulva: normal external genitalia, no lesions   Vagina:  normal mucosa, normal discharge   Cervix: no lesions and normal, pap smear done.    Adnexa: normal adnexa and no mass, fullness, tenderness   Bony  Pelvis: average  System: General: well-developed, well-nourished female in no acute distress   Breast:  normal appearance, no masses or tenderness   Skin: normal coloration and turgor, no rashes   Neurologic: oriented, normal, negative, normal mood   Extremities: normal strength, tone, and muscle mass, ROM of all joints is normal   HEENT PERRLA, extraocular movement intact and sclera clear, anicteric   Mouth/Teeth mucous membranes moist, pharynx normal without lesions and dental hygiene good   Neck supple and no masses   Cardiovascular: regular rate and rhythm   Respiratory:  no respiratory distress, normal breath sounds   Abdomen: soft, non-tender; bowel sounds normal; no masses,  no organomegaly     Assessment:   Pregnancy: G2P0010 Patient Active Problem List   Diagnosis Date Noted  . Supervision of other normal pregnancy, antepartum 03/16/2018  . Faintness 10/11/2015  . Asthma   . Seasonal allergies   . Allergy   . Syncope      Plan:  1. Supervision of other normal pregnancy, antepartum - [facility] AMB BABYSCRIPTS OPT IN declines OS - Culture, OB Urine - GC/Chlamydia probe amp ([place])not at [facility] - Hemoglobinopathy Evaluation - Obstetric  Panel, Including HIV - SMN1 COPY NUMBER ANALYSIS (SMA Carrier Screen) - Cystic fibrosis gene test   Initial labs drawn. Continue prenatal vitamins. Genetic Screening discussed, NIPS: needs to be ordered at next visit. Ultrasound discussed; fetal anatomic survey: will need at 20 wks. Problem list reviewed and updated. The nature of [facility] with multiple MDs and other Advanced Practice Providers was explained to patient; also emphasized that residents, students are part of our team. Routine obstetric precautions reviewed. Return in 1 month (on 04/13/2018).

## 2018-03-18 LAB — GC/CHLAMYDIA PROBE AMP (~~LOC~~) NOT AT ARMC
Chlamydia: NEGATIVE
NEISSERIA GONORRHEA: NEGATIVE

## 2018-03-18 LAB — CULTURE, OB URINE

## 2018-03-18 LAB — URINE CULTURE, OB REFLEX

## 2018-03-23 LAB — SMN1 COPY NUMBER ANALYSIS (SMA CARRIER SCREENING)

## 2018-03-23 LAB — CYSTIC FIBROSIS GENE TEST

## 2018-03-23 LAB — SPECIMEN STATUS REPORT

## 2018-04-01 ENCOUNTER — Encounter: Payer: Self-pay | Admitting: Obstetrics & Gynecology

## 2018-04-01 DIAGNOSIS — O23591 Infection of other part of genital tract in pregnancy, first trimester: Principal | ICD-10-CM

## 2018-04-01 DIAGNOSIS — N760 Acute vaginitis: Secondary | ICD-10-CM

## 2018-04-01 MED ORDER — METRONIDAZOLE 500 MG PO TABS: 500.0000 mg | ORAL_TABLET | Freq: Two times a day (BID) | ORAL | 2 refills | Status: DC

## 2018-04-15 ENCOUNTER — Ambulatory Visit (INDEPENDENT_AMBULATORY_CARE_PROVIDER_SITE_OTHER): Payer: Managed Care, Other (non HMO) | Admitting: Family Medicine

## 2018-04-15 ENCOUNTER — Other Ambulatory Visit: Payer: Self-pay | Admitting: Family Medicine

## 2018-04-15 VITALS — BP 116/74 | HR 88 | Wt 167.0 lb

## 2018-04-15 DIAGNOSIS — J4520 Mild intermittent asthma, uncomplicated: Secondary | ICD-10-CM

## 2018-04-15 DIAGNOSIS — Z3481 Encounter for supervision of other normal pregnancy, first trimester: Secondary | ICD-10-CM

## 2018-04-15 DIAGNOSIS — Z3480 Encounter for supervision of other normal pregnancy, unspecified trimester: Secondary | ICD-10-CM

## 2018-04-15 NOTE — Patient Instructions (Addendum)
For nasal congestion: Claritin or Zyrtec Mucinex DM  Safe Medications in Pregnancy   Acne: Benzoyl Peroxide Salicylic Acid  Backache/Headache: Tylenol: 2 regular strength every 4 hours OR              2 Extra strength every 6 hours  Colds/Coughs/Allergies: Benadryl (alcohol free) 25 mg every 6 hours as needed Breath right strips Claritin Cepacol throat lozenges Chloraseptic throat spray Cold-Eeze- up to three times per day Cough drops, alcohol free Flonase (by prescription only) Guaifenesin Mucinex Robitussin DM (plain only, alcohol free) Saline nasal spray/drops Sudafed (pseudoephedrine) & Actifed ** use only after [redacted] weeks gestation and if you do not have high blood pressure Tylenol Vicks Vaporub Zinc lozenges Zyrtec   Constipation: Colace Ducolax suppositories Fleet enema Glycerin suppositories Metamucil Milk of magnesia Miralax Senokot Smooth move tea  Diarrhea: Kaopectate Imodium A-D  *NO pepto Bismol  Hemorrhoids: Anusol Anusol HC Preparation H Tucks  Indigestion: Tums Maalox Mylanta Zantac  Pepcid  Insomnia: Benadryl (alcohol free) 25mg  every 6 hours as needed Tylenol PM Unisom, no Gelcaps  Leg Cramps: Tums MagGel  Nausea/Vomiting:  Bonine Dramamine Emetrol Ginger extract Sea bands Meclizine  Nausea medication to take during pregnancy:  Unisom (doxylamine succinate 25 mg tablets) Take one tablet daily at bedtime. If symptoms are not adequately controlled, the dose can be increased to a maximum recommended dose of two tablets daily (1/2 tablet in the morning, 1/2 tablet mid-afternoon and one at bedtime). Vitamin B6 100mg  tablets. Take one tablet twice a day (up to 200 mg per day).  Skin Rashes: Aveeno products Benadryl cream or 25mg  every 6 hours as needed Calamine Lotion 1% cortisone cream  Yeast infection: Gyne-lotrimin 7 Monistat 7   **If taking multiple medications, please check labels to avoid duplicating the  same active ingredients **take medication as directed on the label ** Do not exceed 4000 mg of tylenol in 24 hours **Do not take medications that contain aspirin or ibuprofen

## 2018-04-15 NOTE — Progress Notes (Signed)
   PRENATAL VISIT NOTE  Subjective:  Stacey Wyatt is a 22 y.o. G2P0010 at 2638w1d being seen today for ongoing prenatal care.  She is currently monitored for the following issues for this low-risk pregnancy and has Asthma; Seasonal allergies; Allergy; Syncope; Faintness; and Supervision of other normal pregnancy, antepartum on their problem list.  Patient reports no complaints.  Contractions: Not present. Vag. Bleeding: None.  Movement: Absent. Denies leaking of fluid.   The following portions of the patient's history were reviewed and updated as appropriate: allergies, current medications, past family history, past medical history, past social history, past surgical history and problem list. Problem list updated.  Objective:   Vitals:   04/15/18 0952  BP: 116/74  Pulse: 88  Weight: 167 lb (75.8 kg)    Fetal Status: Fetal Heart Rate (bpm): 155   Movement: Absent     General:  Alert, oriented and cooperative. Patient is in no acute distress.  Skin: Skin is warm and dry. No rash noted.   Cardiovascular: Normal heart rate noted  Respiratory: Normal respiratory effort, no problems with respiration noted  Abdomen: Soft, gravid, appropriate for gestational age.  Pain/Pressure: Present     Pelvic: Cervical exam deferred        Extremities: Normal range of motion.  Edema: None  Mental Status: Normal mood and affect. Normal behavior. Normal judgment and thought content.   Assessment and Plan:  Pregnancy: G2P0010 at 7938w1d  1. Supervision of other normal pregnancy, antepartum FHT and FH normal - CBC - Sickle Cell Scr - Type and screen; Future - Genetic Screening  2. Intermittent asthma without complication, unspecified asthma severity   Preterm labor symptoms and general obstetric precautions including but not limited to vaginal bleeding, contractions, leaking of fluid and fetal movement were reviewed in detail with the patient. Please refer to After Visit Summary for other  counseling recommendations.  Return in about 1 month (around 05/13/2018).  No future appointments.  Levie HeritageJacob J Stinson, DO

## 2018-04-18 LAB — CBC
Hematocrit: 39.8 % (ref 34.0–46.6)
Hemoglobin: 13.2 g/dL (ref 11.1–15.9)
MCH: 30.1 pg (ref 26.6–33.0)
MCHC: 33.2 g/dL (ref 31.5–35.7)
MCV: 91 fL (ref 79–97)
PLATELETS: 332 10*3/uL (ref 150–450)
RBC: 4.38 x10E6/uL (ref 3.77–5.28)
RDW: 13.1 % (ref 12.3–15.4)
WBC: 5.3 10*3/uL (ref 3.4–10.8)

## 2018-04-18 LAB — SICKLE CELL SCREEN: SICKLE CELL SCREEN: NEGATIVE

## 2018-04-27 ENCOUNTER — Encounter: Payer: Self-pay | Admitting: Family Medicine

## 2018-04-28 ENCOUNTER — Other Ambulatory Visit: Payer: Self-pay

## 2018-04-28 ENCOUNTER — Telehealth: Payer: Self-pay

## 2018-04-28 NOTE — Progress Notes (Signed)
Sp[facility] [employer] [facility] Charlesetta GaribaldiKatlyn states that a new form will be sent to the office to be filled out to have pt's fetal sex reported. Form was filled out and [facility] to [facility]. Emailed pt to inform her that we should receive results within 24 to 48 hours.

## 2018-04-28 NOTE — Telephone Encounter (Signed)
error 

## 2018-05-02 ENCOUNTER — Telehealth: Payer: Self-pay

## 2018-05-02 NOTE — Telephone Encounter (Signed)
Pt emailed wanting gender results of her Panorama. Called pt to give results, pt states that she does not want to know the gender because her sister is having a gender reveal.

## 2018-05-06 ENCOUNTER — Telehealth: Payer: Self-pay

## 2018-05-06 NOTE — Telephone Encounter (Signed)
Pt called the office stating that she thinks she has a sinus infection. Pt denies fever,cough and chills but is having headaches and a sore throat. Pt states that per Dr. Adrian BlackwaterStinson, she can take benadryl. Pt states that she took one dose of benadryl because she is afraid to take too much medication. Pt advised to continue to take benadryl and was also given a list of other medications safe to take during pregnancy. Pt advised to rest and drink plenty of fluids and to keep appointment scheduled for 05/13/18.  Understanding was voiced.

## 2018-05-09 ENCOUNTER — Encounter: Payer: Self-pay | Admitting: Family Medicine

## 2018-05-10 ENCOUNTER — Ambulatory Visit (INDEPENDENT_AMBULATORY_CARE_PROVIDER_SITE_OTHER): Payer: Managed Care, Other (non HMO) | Admitting: Nurse Practitioner

## 2018-05-10 ENCOUNTER — Encounter: Payer: Self-pay | Admitting: Nurse Practitioner

## 2018-05-10 VITALS — BP 127/77 | HR 78 | Temp 97.8°F | Wt 169.0 lb

## 2018-05-10 DIAGNOSIS — J302 Other seasonal allergic rhinitis: Secondary | ICD-10-CM

## 2018-05-10 DIAGNOSIS — Z3480 Encounter for supervision of other normal pregnancy, unspecified trimester: Secondary | ICD-10-CM

## 2018-05-10 DIAGNOSIS — J0100 Acute maxillary sinusitis, unspecified: Secondary | ICD-10-CM

## 2018-05-10 NOTE — Progress Notes (Signed)
Patient having mucous nasal drainage for several weeks. Patient states she has been feeling really weak. Armandina StammerJennifer Torri Langston RN

## 2018-05-10 NOTE — Patient Instructions (Signed)

## 2018-05-10 NOTE — Progress Notes (Signed)
    Subjective:  Stacey Wyatt is a 22 y.o. G2P0010 at 6152w5d being seen today for ongoing prenatal care.  She is currently monitored for the following issues for this low-risk pregnancy and has Asthma; Seasonal allergies; Allergy; Syncope; Faintness; and Supervision of other normal pregnancy, antepartum on their problem list.  Patient reports nasal stuffiness and sinus drainage.  Thinks she has a sinusitis.  Is better this week than last week.  Has a history of nasal surgery for polyps and it is like then.  She has not had headaches this week but did last week.  She had a sore throat last week but not this week.  she is taking her nasal spray but only had Benadryl once last week..  Contractions: Not present. Vag. Bleeding: None.  Movement: Present. Denies leaking of fluid.   The following portions of the patient's history were reviewed and updated as appropriate: allergies, current medications, past family history, past medical history, past social history, past surgical history and problem list. Problem list updated.  Objective:   Vitals:   05/10/18 1002  BP: 127/77  Pulse: 78  Temp: 97.8 F (36.6 C)  Weight: 169 lb (76.7 kg)    Fetal Status: Fetal Heart Rate (bpm): 145   Movement: Present     General:  Alert, oriented and cooperative. Patient is in no acute distress.  Skin: Skin is warm and dry. No rash noted.   Cardiovascular: Normal heart rate noted  Respiratory: Normal respiratory effort, no problems with respiration noted  Abdomen: Soft, gravid, appropriate for gestational age. Pain/Pressure: Present     Pelvic:  Cervical exam deferred        Extremities: Normal range of motion.  Edema: None  Mental Status: Normal mood and affect. Normal behavior. Normal judgment and thought content.  Lymph node mildly tender noted on right side of her neck.  Pharynx - very mildly pink, no swollen tonsils.  Urinalysis: Urine Protein: Negative Urine Glucose: Negative  Assessment and Plan:    Pregnancy: G2P0010 at 2052w5d  1. Supervision of other normal pregnancy, antepartum  - US MFM OB COMP + 14 WK; Future  2. Seasonal allergies Will add claritin  3. Subacute maxillary sinusitis Discussed that she is much better this week than last week and that antibiotics are not indicated unless she gets worse.  Client is agreeable with this plan as she does not like taking antibiotics.  Will add Claritin to her nasal spray and will use albuteral as needed.  Will continue 64 ounces of fluid daily.  Will call the office if she is getting worse.  Preterm labor symptoms and general obstetric precautions including but not limited to vaginal bleeding, contractions, leaking of fluid and fetal movement were reviewed in detail with the patient. Please refer to After Visit Summary for other counseling recommendations.  Return in about 1 month (around 06/07/2018).  Nolene BernheimERRI Clare Fennimore, RN, MSN, NP-BC Nurse Practitioner, [facility], [facility] 05/10/2018 10:35 AM

## 2018-05-13 ENCOUNTER — Encounter: Payer: Managed Care, Other (non HMO) | Admitting: Obstetrics & Gynecology

## 2018-06-01 ENCOUNTER — Encounter (HOSPITAL_COMMUNITY): Payer: Self-pay

## 2018-06-08 ENCOUNTER — Ambulatory Visit (HOSPITAL_COMMUNITY)
Admission: RE | Admit: 2018-06-08 | Discharge: 2018-06-08 | Disposition: A | Discharge status: Home / Self Care | Payer: Managed Care, Other (non HMO) | Source: Ambulatory Visit | Attending: Nurse Practitioner | Admitting: Nurse Practitioner

## 2018-06-08 ENCOUNTER — Ambulatory Visit (INDEPENDENT_AMBULATORY_CARE_PROVIDER_SITE_OTHER): Payer: Managed Care, Other (non HMO) | Admitting: Obstetrics & Gynecology

## 2018-06-08 VITALS — BP 115/76 | HR 92 | Wt 174.1 lb

## 2018-06-08 DIAGNOSIS — O4442 Low lying placenta NOS or without hemorrhage, second trimester: Secondary | ICD-10-CM | POA: Insufficient documentation

## 2018-06-08 DIAGNOSIS — Z3480 Encounter for supervision of other normal pregnancy, unspecified trimester: Secondary | ICD-10-CM

## 2018-06-08 DIAGNOSIS — IMO0002 Reserved for concepts with insufficient information to code with codable children: Secondary | ICD-10-CM

## 2018-06-08 DIAGNOSIS — Z3689 Encounter for other specified antenatal screening: Secondary | ICD-10-CM | POA: Diagnosis not present

## 2018-06-08 DIAGNOSIS — Z3A18 18 weeks gestation of pregnancy: Secondary | ICD-10-CM | POA: Insufficient documentation

## 2018-06-08 DIAGNOSIS — Z0489 Encounter for examination and observation for other specified reasons: Secondary | ICD-10-CM

## 2018-06-08 DIAGNOSIS — Z363 Encounter for antenatal screening for malformations: Secondary | ICD-10-CM | POA: Diagnosis not present

## 2018-06-08 NOTE — Progress Notes (Signed)
PRENATAL VISIT NOTE  Subjective:  Stacey Wyatt is a 22 y.o. G2P0010 at [redacted]w[redacted]d being seen today for ongoing prenatal care. Just had anatomy scan today that showed a low lying posterior placenta, 1.2 cm from os; was also incomplete for face and heart (see report below).  She is currently monitored for the following issues for this low-risk pregnancy and has Asthma; Seasonal allergies; Allergy; Syncope; Faintness; Supervision of other normal pregnancy, antepartum; and Low-lying placenta in second trimester on their problem list.  Patient reports no complaints.  Contractions: Not present. Vag. Bleeding: None.  Movement: Present. Denies leaking of fluid.   The following portions of the patient's history were reviewed and updated as appropriate: allergies, current medications, past family history, past medical history, past social history, past surgical history and problem list. Problem list updated.  Objective:   Vitals:   06/08/18 1357  BP: 115/76  Pulse: 92  Weight: 174 lb 1.3 oz (79 kg)    Fetal Status: Fetal Heart Rate (bpm): 141 Fundal Height: 19 cm Movement: Present     General:  Alert, oriented and cooperative. Patient is in no acute distress.  Skin: Skin is warm and dry. No rash noted.   Cardiovascular: Normal heart rate noted  Respiratory: Normal respiratory effort, no problems with respiration noted  Abdomen: Soft, gravid, appropriate for gestational age.  Pain/Pressure: Absent     Pelvic: Cervical exam deferred        Extremities: Normal range of motion.  Edema: None  Mental Status: Normal mood and affect. Normal behavior. Normal judgment and thought content.   [place] Mfm Ob Comp + 14 Wk  Result Date: 06/08/2018 ----------------------------------------------------------------------  OBSTETRICS REPORT                       (Signed Final 06/08/2018 02:27 pm) ---------------------------------------------------------------------- Patient Info  ID #:       [id number]                           D.O.B.:  Feb 12, 1996 (22 yrs)  Name:       Stacey Wyatt                Visit Date: 06/08/2018 12:07 pm ---------------------------------------------------------------------- Performed By  Performed By:     Marcellina Millin          Ref. Address:     Faculty Practice                    RDMS  Attending:        Patsi Sears      Location:         [facility]                    MD  Referred By:      Currie Paris NP ---------------------------------------------------------------------- Orders   #  Description                          Code         Ordered By   1  [place] MFM OB COMP + 14 WK               56213.08     Nolene Bernheim  ----------------------------------------------------------------------   #  Order #  Accession #                 Episode #   1  [id number]                  [id number]                  [id number]  ---------------------------------------------------------------------- Indications   Antenatal screening for malformations          Z36.3   [redacted] weeks gestation of pregnancy                Z3A.18  ---------------------------------------------------------------------- Fetal Evaluation  Num Of Fetuses:         1  Fetal Heart Rate(bpm):  148  Cardiac Activity:       Observed  Presentation:           Cephalic  Placenta:               Posterior, low-lying, 1.2cm from int os  P. Cord Insertion:      Visualized, central  Amniotic Fluid  AFI FV:      Within normal limits ---------------------------------------------------------------------- Biometry  BPD:      42.5  mm     G. Age:  18w 6d         51  %    CI:        78.34   %    70 - 86                                                          FL/HC:      18.2   %    16.1 - 18.3  HC:      151.9  mm     G. Age:  18w 1d         16  %    HC/AC:      1.18        1.09 - 1.39  AC:       129   mm     G. Age:  18w 4d         33  %    FL/BPD:     65.2   %  FL:       27.7  mm     G. Age:  18w 3d         30  %    FL/AC:       21.5   %    20 - 24  HUM:      26.6  mm     G. Age:  18w 3d         39  %  CER:      18.6  mm     G. Age:  18w 2d         32  %  Est. FW:     241  gm      0 lb 9 oz     37  % ---------------------------------------------------------------------- OB History  Gravidity:    2         Term:   0        Prem:   0        SAB:   1  TOP:  0       Ectopic:  0        Living: 0 ---------------------------------------------------------------------- Gestational Age  LMP:           18w 6d        Date:  01/27/18                 EDD:   11/03/18  U/S Today:     18w 4d                                        EDD:   11/05/18  Best:          18w 6d     Det. By:  LMP  (01/27/18)          EDD:   11/03/18 ---------------------------------------------------------------------- Anatomy  Cranium:               Appears normal         Aortic Arch:            Appears normal  Cavum:                 Appears normal         Ductal Arch:            Appears normal  Ventricles:            Appears normal         Diaphragm:              Appears normal  Choroid Plexus:        Appears normal         Stomach:                Appears normal, left                                                                        sided  Cerebellum:            Appears normal         Abdomen:                Appears normal  Posterior Fossa:       Appears normal         Abdominal Wall:         Appears nml (cord                                                                        insert, abd wall)  Nuchal Fold:           Appears normal         Cord Vessels:           Appears normal (3  vessel cord)  Face:                  Orbits nl; profile not Kidneys:                Appear normal                         well visualized  Lips:                  Appears normal         Bladder:                Appears normal  Thoracic:              Appears normal         Spine:                  Appears normal  Heart:                  Appears normal         Upper Extremities:      Appears normal                         (4CH, axis, and situs  RVOT:                  Not well visualized    Lower Extremities:      Appears normal  LVOT:                  Not well visualized  Other:  Female gender Heels visualized. Left 5th digit visualized. ---------------------------------------------------------------------- Cervix Uterus Adnexa  Cervix  Length:            4.6  cm.  Normal appearance by transabdominal scan. ---------------------------------------------------------------------- Comments  U/S images reviewed.   Appropriate fetal growth is noted.  No  fetal abnormalities are seen.  Low-lying placenta is seen.  Recommendations: 1) Follow-up growth, placental  localization and completion of anatomy in 4 weeks ---------------------------------------------------------------------- Recommendations  1) Follow-up growth, placental localization and completion of  anatomy in 4 weeks ----------------------------------------------------------------------               Patsi Sears, MD Electronically Signed Final Report   06/08/2018 02:27 pm ----------------------------------------------------------------------   Assessment and Plan:  Pregnancy: G2P0010 at [redacted]w[redacted]d  1. Low-lying placenta in second trimester Patient informed of results. Previa precautions emphasized. Bleeding precautions also emphasized. Follow up scan ordered. - [place] MFM OB FOLLOW UP; Future  2. Evaluate anatomy not seen on prior sonogram Incomplete anatomy scan today, follow up ordered - US MFM OB FOLLOW UP; Future  3. Supervision of other normal pregnancy, antepartum No other complaints or concerns.  Routine obstetric precautions reviewed. Please refer to After Visit Summary for other counseling recommendations.  Return in about 4 weeks (around 07/06/2018) for OB Visit.  Future Appointments  Date Time Provider Department Center  07/06/2018  1:00 PM [facility] [place] 3  [facility]  07/07/2018  3:30 PM Levie Heritage, DO [facility] None    Jaynie Collins, MD

## 2018-06-08 NOTE — Patient Instructions (Addendum)
Return to clinic for any scheduled appointments or obstetric concerns, or go to [facility]U for evaluation   Placenta Previa (You have a low lying placenta/marginal placenta previa) Placenta previa is a condition in which the placenta implants in the lower part of the uterus in pregnant women. The placenta either partially or completely covers the opening to the cervix. This is a problem because the baby must pass through the cervix during delivery. There are three types of placenta previa:  Marginal placenta previa. The placenta reaches within an inch (2.5 cm) of the cervical opening but does not cover it. (Yours is 1.2 cm)  Partial placenta previa. The placenta covers part of the cervical opening.  Complete placenta previa. The placenta covers the entire cervical opening.  If the previa is marginal or partial and it is diagnosed in the first half of pregnancy, the placenta may move into a normal position as the pregnancy progresses and may no longer cover the cervix. It is important to keep all prenatal visits with your health care provider so you can be more closely monitored. What are the causes? The cause of this condition is not known. What increases the risk? This condition is more likely to develop in women who:  Are carrying more than one baby (multiples).  Have an abnormally shaped uterus.  Have scars on the lining of the uterus.  Have had surgeries involving the uterus, such as a cesarean delivery.  Have delivered a baby before.  Have a history of placenta previa.  Have smoked or used cocaine during pregnancy.  Are age 44 or older during pregnancy.  What are the signs or symptoms? The main symptom of this condition is sudden, painless vaginal bleeding during the second half of pregnancy. The amount of bleeding can be very light at first, and it usually stops on its own. Heavier bleeding episodes may also happen. Some women with placenta previa may have no bleeding at all. How  is this diagnosed?  This condition is diagnosed: ? From an ultrasound. This test uses sound waves to find where the placenta is located before you have any bleeding episodes. ? During a checkup after vaginal bleeding is noticed.  If you are diagnosed with a partial or complete previa, digital exams with fingers will generally be avoided. Your health care provider will still perform a speculum exam.  If you did not have an ultrasound during your pregnancy, placenta previa may not be diagnosed until bleeding occurs during labor. How is this treated? Treatment for this condition may include:  Decreased activity.  Bed rest at home or in the hospital.  Pelvic rest. Nothing is placed inside the vagina during pelvic rest. This means not having sex and not using tampons or douches.  A blood transfusion to replace blood that you have lost (maternal blood loss).  A cesarean delivery. This may be performed if: ? The bleeding is heavy and cannot be controlled. ? The placenta completely covers the cervix.  Medicines to stop premature labor or to help the baby's lungs to mature. This treatment may be used if you need delivery before your pregnancy is full-term.  Your treatment will be decided based on:  How much you are bleeding, or whether the bleeding has stopped.  How far along you are in your pregnancy.  The condition of your baby.  The type of placenta previa that you have.  Follow these instructions at home:  Get plenty of rest and lessen activity as told by your  health care provider.  Stay on bed rest for as long as told by your health care provider.  Do not have sex, use tampons, use a douche, or place anything inside of your vagina if your health care provider recommended pelvic rest.  Take over-the-counter and prescription medicines as told by your health care provider.  Keep all follow-up visits as told by your health care provider. This is important. Get help right away  if:  You have vaginal bleeding, even if in small amounts and even if you have no pain.  You have cramping or regular contractions.  You have pain in your abdomen or your lower back.  You have a feeling of increased pressure in your pelvis.  You have increased watery or bloody mucus from the vagina. This information is not intended to replace advice given to you by your health care provider. Make sure you discuss any questions you have with your health care provider. Document Released: 09/21/2005 Document Revised: 06/10/2016 Document Reviewed: 04/04/2016 Elsevier Interactive Patient Education  2018 [employer] Inc[employer]   Pelvic Rest Pelvic rest may be recommended if:  Your placenta is partially or completely covering the opening of your cervix (placenta previa).  There is bleeding between the wall of the uterus and the amniotic sac in the first trimester of pregnancy (subchorionic hemorrhage).  You went into labor too early (preterm labor).  Based on your overall health and the health of your baby, your health care provider will decide if pelvic rest is right for you. How do I rest my pelvis? For as long as told by your health care provider:  Do not have sex, sexual stimulation, or an orgasm.  Do not use tampons. Do not douche. Do not put anything in your vagina.  Do not lift anything that is heavier than 10 lb (4.5 kg).  Avoid activities that take a lot of effort (are strenuous).  Avoid any activity in which your pelvic muscles could become strained.  When should I seek medical care? Seek medical care if you have:  Cramping pain in your lower abdomen.  Vaginal discharge.  A low, dull backache.  Regular contractions.  Uterine tightening.  When should I seek immediate medical care? Seek immediate medical care if:  You have vaginal bleeding and you are pregnant.  This information is not intended to replace advice given to you by your health care provider. Make sure you  discuss any questions you have with your health care provider. Document Released: 01/16/2011 Document Revised: 02/27/2016 Document Reviewed: 03/25/2015 Elsevier Interactive Patient Education  2018 [employer].   Second Trimester of Pregnancy The second trimester is from week 13 through week 28, month 4 through 6. This is often the time in pregnancy that you feel your best. Often times, morning sickness has lessened or quit. You may have more energy, and you may get hungry more often. Your unborn baby (fetus) is growing rapidly. At the end of the sixth month, he or she is about 9 inches long and weighs about 1 pounds. You will likely feel the baby move (quickening) between 18 and 20 weeks of pregnancy.  Research childbirth classes and hospital preregistration at [email or web address]  Follow these instructions at home:  Avoid all smoking, herbs, and alcohol. Avoid drugs not approved by your doctor.  Do not use any tobacco products, including cigarettes, chewing tobacco, and electronic cigarettes. If you need help quitting, ask your doctor. You may get counseling or other support to help you quit.  Only  take medicine as told by your doctor. Some medicines are safe and some are not during pregnancy.  Exercise only as told by your doctor. Stop exercising if you start having cramps.  Eat regular, healthy meals.  Wear a good support bra if your breasts are tender.  Do not use hot tubs, steam rooms, or saunas.  Wear your seat belt when driving.  Avoid raw meat, uncooked cheese, and liter boxes and soil used by cats.  Take your prenatal vitamins.  Take 1500-2000 milligrams of calcium daily starting at the 20th week of pregnancy until you deliver your baby.  Try taking medicine that helps you poop (stool softener) as needed, and if your doctor approves. Eat more fiber by eating fresh fruit, vegetables, and whole grains. Drink enough fluids to keep your pee (urine) clear or pale  yellow.  Take warm water baths (sitz baths) to soothe pain or discomfort caused by hemorrhoids. Use hemorrhoid cream if your doctor approves.  If you have puffy, bulging veins (varicose veins), wear support hose. Raise (elevate) your feet for 15 minutes, 3-4 times a day. Limit salt in your diet.  Avoid heavy lifting, wear low heals, and sit up straight.  Rest with your legs raised if you have leg cramps or low back pain.  Visit your dentist if you have not gone during your pregnancy. Use a soft toothbrush to brush your teeth. Be gentle when you floss.  You can have sex (intercourse) unless your doctor tells you not to.  Go to your doctor visits.  Get help if:  You feel dizzy.  You have mild cramps or pressure in your lower belly (abdomen).  You have a nagging pain in your belly area.  You continue to feel sick to your stomach (nauseous), throw up (vomit), or have watery poop (diarrhea).  You have bad smelling fluid coming from your vagina.  You have pain with peeing (urination). Get help right away if:  You have a fever.  You are leaking fluid from your vagina.  You have spotting or bleeding from your vagina.  You have severe belly cramping or pain.  You lose or gain weight rapidly.  You have trouble catching your breath and have chest pain.  You notice sudden or extreme puffiness (swelling) of your face, hands, ankles, feet, or legs.  You have not felt the baby move in over an hour.  You have severe headaches that do not go away with medicine.  You have vision changes. This information is not intended to replace advice given to you by your health care provider. Make sure you discuss any questions you have with your health care provider. Document Released: 12/16/2009 Document Revised: 02/27/2016 Document Reviewed: 11/22/2012 Elsevier Interactive Patient Education  2017 [employer].

## 2018-06-09 ENCOUNTER — Other Ambulatory Visit (HOSPITAL_COMMUNITY): Payer: Self-pay | Admitting: *Deleted

## 2018-06-09 DIAGNOSIS — Z362 Encounter for other antenatal screening follow-up: Secondary | ICD-10-CM

## 2018-06-30 ENCOUNTER — Telehealth: Payer: Self-pay

## 2018-07-06 ENCOUNTER — Other Ambulatory Visit (HOSPITAL_COMMUNITY): Payer: Self-pay | Admitting: Maternal and Fetal Medicine

## 2018-07-06 ENCOUNTER — Ambulatory Visit (HOSPITAL_COMMUNITY)
Admission: RE | Admit: 2018-07-06 | Discharge: 2018-07-06 | Disposition: A | Discharge status: Home / Self Care | Payer: Managed Care, Other (non HMO) | Source: Ambulatory Visit | Attending: Nurse Practitioner | Admitting: Nurse Practitioner

## 2018-07-06 DIAGNOSIS — O4442 Low lying placenta NOS or without hemorrhage, second trimester: Secondary | ICD-10-CM

## 2018-07-06 DIAGNOSIS — Z362 Encounter for other antenatal screening follow-up: Secondary | ICD-10-CM | POA: Diagnosis not present

## 2018-07-06 DIAGNOSIS — Z3A22 22 weeks gestation of pregnancy: Secondary | ICD-10-CM

## 2018-07-07 ENCOUNTER — Ambulatory Visit (INDEPENDENT_AMBULATORY_CARE_PROVIDER_SITE_OTHER): Payer: Managed Care, Other (non HMO) | Admitting: Family Medicine

## 2018-07-07 VITALS — BP 106/66 | HR 91 | Wt 178.0 lb

## 2018-07-07 DIAGNOSIS — O4442 Low lying placenta NOS or without hemorrhage, second trimester: Secondary | ICD-10-CM

## 2018-07-07 DIAGNOSIS — Z3480 Encounter for supervision of other normal pregnancy, unspecified trimester: Secondary | ICD-10-CM

## 2018-07-07 NOTE — Progress Notes (Signed)
   PRENATAL VISIT NOTE  Subjective:  Stacey Wyatt is a 22 y.o. G2P0010 at [redacted]w[redacted]d being seen today for ongoing prenatal care.  She is currently monitored for the following issues for this low-risk pregnancy and has Asthma; Seasonal allergies; Allergy; Syncope; Faintness; Supervision of other normal pregnancy, antepartum; and Low-lying placenta in second trimester on their problem list.  Patient reports no complaints.  Contractions: Not present. Vag. Bleeding: None.  Movement: Present. Denies leaking of fluid.   The following portions of the patient's history were reviewed and updated as appropriate: allergies, current medications, past family history, past medical history, past social history, past surgical history and problem list. Problem list updated.  Objective:   Vitals:   07/07/18 1533  BP: 106/66  Pulse: 91  Weight: 178 lb 0.6 oz (80.8 kg)    Fetal Status: Fetal Heart Rate (bpm): 144   Movement: Present     General:  Alert, oriented and cooperative. Patient is in no acute distress.  Skin: Skin is warm and dry. No rash noted.   Cardiovascular: Normal heart rate noted  Respiratory: Normal respiratory effort, no problems with respiration noted  Abdomen: Soft, gravid, appropriate for gestational age.  Pain/Pressure: Present     Pelvic: Cervical exam deferred        Extremities: Normal range of motion.  Edema: None  Mental Status: Normal mood and affect. Normal behavior. Normal judgment and thought content.   Assessment and Plan:  Pregnancy: G2P0010 at [redacted]w[redacted]d  1. Supervision of other normal pregnancy, antepartum FHT and FH normal  2. Low-lying placenta in second trimester resolved  Preterm labor symptoms and general obstetric precautions including but not limited to vaginal bleeding, contractions, leaking of fluid and fetal movement were reviewed in detail with the patient. Please refer to After Visit Summary for other counseling recommendations.  Return in about 4 weeks  (around 08/04/2018) for OB f/u, 2 hr GTT.  Future Appointments  Date Time Provider Department Center  08/04/2018  9:15 AM Levie Heritage, DO [facility] None    Levie Heritage, DO

## 2018-07-07 NOTE — Telephone Encounter (Signed)
error 

## 2018-07-12 ENCOUNTER — Encounter (HOSPITAL_COMMUNITY): Payer: Self-pay | Admitting: *Deleted

## 2018-07-12 ENCOUNTER — Inpatient Hospital Stay (HOSPITAL_COMMUNITY)
Admission: AD | Admit: 2018-07-12 | Discharge: 2018-07-12 | Disposition: A | Discharge status: Home / Self Care | Payer: Managed Care, Other (non HMO) | Source: Ambulatory Visit | Attending: Obstetrics & Gynecology | Admitting: Obstetrics & Gynecology

## 2018-07-12 DIAGNOSIS — Z3A24 24 weeks gestation of pregnancy: Secondary | ICD-10-CM | POA: Diagnosis not present

## 2018-07-12 DIAGNOSIS — O26892 Other specified pregnancy related conditions, second trimester: Secondary | ICD-10-CM | POA: Diagnosis not present
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
## 2018-07-12 NOTE — Progress Notes (Signed)
EFM tracing sketchy @ times secondary fetus active & gestational age.
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
## 2018-08-04 ENCOUNTER — Encounter: Payer: Self-pay | Admitting: Family Medicine

## 2018-08-04 ENCOUNTER — Ambulatory Visit (INDEPENDENT_AMBULATORY_CARE_PROVIDER_SITE_OTHER): Payer: Managed Care, Other (non HMO) | Admitting: Family Medicine

## 2018-08-04 VITALS — BP 100/56 | HR 90 | Wt 186.0 lb

## 2018-08-04 DIAGNOSIS — J4520 Mild intermittent asthma, uncomplicated: Secondary | ICD-10-CM

## 2018-08-04 DIAGNOSIS — Z3482 Encounter for supervision of other normal pregnancy, second trimester: Secondary | ICD-10-CM

## 2018-08-04 DIAGNOSIS — J302 Other seasonal allergic rhinitis: Secondary | ICD-10-CM

## 2018-08-04 DIAGNOSIS — O4442 Low lying placenta NOS or without hemorrhage, second trimester: Secondary | ICD-10-CM

## 2018-08-04 DIAGNOSIS — Z3480 Encounter for supervision of other normal pregnancy, unspecified trimester: Secondary | ICD-10-CM

## 2018-08-04 MED ORDER — BUDESONIDE 90 MCG/ACT IN AEPB: 1.0000 | INHALATION_SPRAY | Freq: Two times a day (BID) | RESPIRATORY_TRACT | 3 refills | Status: DC

## 2018-08-04 MED ORDER — LORATADINE 10 MG PO TABS: 10.0000 mg | ORAL_TABLET | Freq: Every day | ORAL | 3 refills | Status: DC

## 2018-08-04 MED ORDER — FAMOTIDINE 20 MG PO TABS: 20.0000 mg | ORAL_TABLET | Freq: Two times a day (BID) | ORAL | 3 refills | Status: DC

## 2018-08-04 NOTE — Progress Notes (Signed)
Patient to do 2 hr gtt today. Patient declined flu shot. [name] RN

## 2018-08-04 NOTE — Progress Notes (Signed)
   PRENATAL VISIT NOTE  Subjective:  Stacey Wyatt is a 22 y.o. G2P0010 at [redacted]w[redacted]d being seen today for ongoing prenatal care.  She is currently monitored for the following issues for this low-risk pregnancy and has Asthma; Seasonal allergies; Allergy; Syncope; Faintness; and Supervision of other normal pregnancy, antepartum on their problem list.  Patient reports heartburn and needing to take albuterol 1-2 times daily. .  Contractions: Not present. Vag. Bleeding: None.  Movement: Present. Denies leaking of fluid.   The following portions of the patient's history were reviewed and updated as appropriate: allergies, current medications, past family history, past medical history, past social history, past surgical history and problem list. Problem list updated.  Objective:   Vitals:   08/04/18 0908  BP: (!) 100/56  Pulse: 90  Weight: 186 lb (84.4 kg)    Fetal Status: Fetal Heart Rate (bpm): 145 Fundal Height: 27 cm Movement: Present     General:  Alert, oriented and cooperative. Patient is in no acute distress.  Skin: Skin is warm and dry. No rash noted.   Cardiovascular: Normal heart rate noted  Respiratory: Normal respiratory effort, no problems with respiration noted  Abdomen: Soft, gravid, appropriate for gestational age.  Pain/Pressure: Present     Pelvic: Cervical exam deferred        Extremities: Normal range of motion.  Edema: None  Mental Status: Normal mood and affect. Normal behavior. Normal judgment and thought content.   Assessment and Plan:  Pregnancy: G2P0010 at [redacted]w[redacted]d  1. Supervision of other normal pregnancy, antepartum FHT and FH normal - Glucose Tolerance, 2 Hours w/1 Hour - CBC - HIV antibody (with reflex) - RPR - ABO/Rh; Future - ABO/Rh  2. Low-lying placenta in second trimester resolved  3. Intermittent asthma without complication, unspecified asthma severity Start pulmicort for control  4. Seasonal allergies Claritin, flonase  Preterm labor  symptoms and general obstetric precautions including but not limited to vaginal bleeding, contractions, leaking of fluid and fetal movement were reviewed in detail with the patient. Please refer to After Visit Summary for other counseling recommendations.  Return in about 2 weeks (around 08/18/2018) for OB f/u.  Future Appointments  Date Time Provider Department Center  08/18/2018  8:45 AM Levie Heritage, DO [facility] None    Levie Heritage, DO

## 2018-08-05 LAB — CBC
Hematocrit: 35 % (ref 34.0–46.6)
Hemoglobin: 12 g/dL (ref 11.1–15.9)
MCH: 30.9 pg (ref 26.6–33.0)
MCHC: 34.3 g/dL (ref 31.5–35.7)
MCV: 90 fL (ref 79–97)
PLATELETS: 281 10*3/uL (ref 150–450)
RBC: 3.88 x10E6/uL (ref 3.77–5.28)
RDW: 12.1 % — ABNORMAL LOW (ref 12.3–15.4)
WBC: 7.2 10*3/uL (ref 3.4–10.8)

## 2018-08-05 LAB — RPR: RPR Ser Ql: NONREACTIVE

## 2018-08-05 LAB — ABO/RH: RH TYPE: POSITIVE

## 2018-08-05 LAB — GLUCOSE TOLERANCE, 2 HOURS W/ 1HR
GLUCOSE, 1 HOUR: 107 mg/dL (ref 65–179)
GLUCOSE, FASTING: 75 mg/dL (ref 65–91)
Glucose, 2 hour: 105 mg/dL (ref 65–152)

## 2018-08-05 LAB — HIV ANTIBODY (ROUTINE TESTING W REFLEX): HIV SCREEN 4TH GENERATION: NONREACTIVE

## 2018-08-11 ENCOUNTER — Telehealth: Payer: Self-pay

## 2018-08-11 MED ORDER — BECLOMETHASONE DIPROP HFA 40 MCG/ACT IN AERB: INHALATION_SPRAY | RESPIRATORY_TRACT | 0 refills | Status: DC

## 2018-08-11 NOTE — Telephone Encounter (Signed)
Patient's insurance not willing to pay for Pulmicort inhaler. Per Dr. Adrian Blackwater patient may try QVAR inhaler. New script sent. [name] RN

## 2018-08-18 ENCOUNTER — Encounter: Payer: Self-pay | Admitting: Family Medicine

## 2018-08-18 ENCOUNTER — Ambulatory Visit (INDEPENDENT_AMBULATORY_CARE_PROVIDER_SITE_OTHER): Payer: Managed Care, Other (non HMO) | Admitting: Family Medicine

## 2018-08-18 VITALS — BP 114/75 | HR 92 | Wt 192.0 lb

## 2018-08-18 DIAGNOSIS — Z3483 Encounter for supervision of other normal pregnancy, third trimester: Secondary | ICD-10-CM

## 2018-08-18 DIAGNOSIS — Z3480 Encounter for supervision of other normal pregnancy, unspecified trimester: Secondary | ICD-10-CM

## 2018-08-18 NOTE — Progress Notes (Signed)
   PRENATAL VISIT NOTE  Subjective:  Stacey Wyatt is a 22 y.o. G2P0010 at 66110w0d being seen today for ongoing prenatal care.  She is currently monitored for the following issues for this low-risk pregnancy and has Asthma; Seasonal allergies; Allergy; Syncope; Faintness; and Supervision of other normal pregnancy, antepartum on their problem list.  Patient reports no complaints.  Contractions: Not present. Vag. Bleeding: None.  Movement: Present. Denies leaking of fluid.   The following portions of the patient's history were reviewed and updated as appropriate: allergies, current medications, past family history, past medical history, past social history, past surgical history and problem list. Problem list updated.  Objective:   Vitals:   08/18/18 0847  BP: 114/75  Pulse: 92  Weight: 192 lb (87.1 kg)    Fetal Status: Fetal Heart Rate (bpm): 140 Fundal Height: 29 cm Movement: Present     General:  Alert, oriented and cooperative. Patient is in no acute distress.  Skin: Skin is warm and dry. No rash noted.   Cardiovascular: Normal heart rate noted  Respiratory: Normal respiratory effort, no problems with respiration noted  Abdomen: Soft, gravid, appropriate for gestational age.  Pain/Pressure: Present     Pelvic: Cervical exam deferred        Extremities: Normal range of motion.  Edema: None  Mental Status: Normal mood and affect. Normal behavior. Normal judgment and thought content.   Assessment and Plan:  Pregnancy: G2P0010 at 21110w0d  1. Supervision of other normal pregnancy, antepartum FHT and FH normal  Preterm labor symptoms and general obstetric precautions including but not limited to vaginal bleeding, contractions, leaking of fluid and fetal movement were reviewed in detail with the patient. Please refer to After Visit Summary for other counseling recommendations.  Return in about 2 weeks (around 09/01/2018) for OB f/u.  No future appointments.  Levie HeritageJacob J Stinson,  DO

## 2018-09-06 ENCOUNTER — Ambulatory Visit (INDEPENDENT_AMBULATORY_CARE_PROVIDER_SITE_OTHER): Payer: Managed Care, Other (non HMO) | Admitting: Obstetrics & Gynecology

## 2018-09-06 ENCOUNTER — Encounter: Payer: Self-pay | Admitting: Obstetrics & Gynecology

## 2018-09-06 VITALS — BP 116/80 | HR 107 | Wt 197.0 lb

## 2018-09-06 DIAGNOSIS — Z3483 Encounter for supervision of other normal pregnancy, third trimester: Secondary | ICD-10-CM

## 2018-09-06 DIAGNOSIS — Z3480 Encounter for supervision of other normal pregnancy, unspecified trimester: Secondary | ICD-10-CM

## 2018-09-06 NOTE — Patient Instructions (Signed)

## 2018-09-06 NOTE — Progress Notes (Signed)
   PRENATAL VISIT NOTE  Subjective:  Stacey Wyatt is a 22 y.o. G2P0010 at 4422w5d being seen today for ongoing prenatal care.  She is currently monitored for the following issues for this low-risk pregnancy and has Asthma; Seasonal allergies; Allergy; Syncope; Faintness; and Supervision of other normal pregnancy, antepartum on their problem list.  Patient reports feeling some mild URI sx. .  Contractions: Not present. Vag. Bleeding: None.  Movement: Present. Denies leaking of fluid.   The following portions of the patient's history were reviewed and updated as appropriate: allergies, current medications, past family history, past medical history, past social history, past surgical history and problem list. Problem list updated.  Objective:   Vitals:   09/06/18 0952  BP: 116/80  Pulse: (!) 107  Weight: 197 lb (89.4 kg)    Fetal Status: Fetal Heart Rate (bpm): 145   Movement: Present     General:  Alert, oriented and cooperative. Patient is in no acute distress.  Skin: Skin is warm and dry. No rash noted.   Cardiovascular: Normal heart rate noted  Respiratory: Normal respiratory effort, no problems with respiration noted  Abdomen: Soft, gravid, appropriate for gestational age.  Pain/Pressure: Present     Pelvic: Cervical exam deferred        Extremities: Normal range of motion.  Edema: None  Mental Status: Normal mood and affect. Normal behavior. Normal judgment and thought content.   Assessment and Plan:  Pregnancy: G2P0010 at 2922w5d  1. Supervision of other normal pregnancy, antepartum Sx relief of URI. Lots of rest and fluids.   Preterm labor symptoms and general obstetric precautions including but not limited to vaginal bleeding, contractions, leaking of fluid and fetal movement were reviewed in detail with the patient. Please refer to After Visit Summary for other counseling recommendations.  Return in about 2 weeks (around 09/20/2018).  No future appointments.  Willodean Rosenthalarolyn  Harraway-Smith, MD

## 2018-09-20 ENCOUNTER — Encounter: Payer: Self-pay | Admitting: Advanced Practice Midwife

## 2018-09-20 ENCOUNTER — Ambulatory Visit (INDEPENDENT_AMBULATORY_CARE_PROVIDER_SITE_OTHER): Payer: Managed Care, Other (non HMO) | Admitting: Advanced Practice Midwife

## 2018-09-20 VITALS — BP 112/59 | HR 95 | Wt 202.8 lb

## 2018-09-20 DIAGNOSIS — R51 Headache: Secondary | ICD-10-CM

## 2018-09-20 DIAGNOSIS — Z3483 Encounter for supervision of other normal pregnancy, third trimester: Secondary | ICD-10-CM

## 2018-09-20 DIAGNOSIS — Z3480 Encounter for supervision of other normal pregnancy, unspecified trimester: Secondary | ICD-10-CM

## 2018-09-20 DIAGNOSIS — R519 Headache, unspecified: Secondary | ICD-10-CM

## 2018-09-20 MED ORDER — BUTALBITAL-APAP-CAFFEINE 50-325-40 MG PO CAPS: 1.0000 | ORAL_CAPSULE | Freq: Four times a day (QID) | ORAL | 3 refills | Status: DC | PRN

## 2018-09-20 NOTE — Progress Notes (Signed)
Pt states that she has been having a headache x 1 week.

## 2018-09-20 NOTE — Patient Instructions (Signed)
Third Trimester of Pregnancy The third trimester is from week 29 through week 42, months 7 through 9. This trimester is when your unborn baby (fetus) is growing very fast. At the end of the ninth month, the unborn baby is about 20 inches in length. It weighs about 6-10 pounds. Follow these instructions at home:  Avoid all smoking, herbs, and alcohol. Avoid drugs not approved by your doctor.  Do not use any tobacco products, including cigarettes, chewing tobacco, and electronic cigarettes. If you need help quitting, ask your doctor. You may get counseling or other support to help you quit.  Only take medicine as told by your doctor. Some medicines are safe and some are not during pregnancy.  Exercise only as told by your doctor. Stop exercising if you start having cramps.  Eat regular, healthy meals.  Wear a good support bra if your breasts are tender.  Do not use hot tubs, steam rooms, or saunas.  Wear your seat belt when driving.  Avoid raw meat, uncooked cheese, and liter boxes and soil used by cats.  Take your prenatal vitamins.  Take 1500-2000 milligrams of calcium daily starting at the 20th week of pregnancy until you deliver your baby.  Try taking medicine that helps you poop (stool softener) as needed, and if your doctor approves. Eat more fiber by eating fresh fruit, vegetables, and whole grains. Drink enough fluids to keep your pee (urine) clear or pale yellow.  Take warm water baths (sitz baths) to soothe pain or discomfort caused by hemorrhoids. Use hemorrhoid cream if your doctor approves.  If you have puffy, bulging veins (varicose veins), wear support hose. Raise (elevate) your feet for 15 minutes, 3-4 times a day. Limit salt in your diet.  Avoid heavy lifting, wear low heels, and sit up straight.  Rest with your legs raised if you have leg cramps or low back pain.  Visit your dentist if you have not gone during your pregnancy. Use a soft toothbrush to brush your  teeth. Be gentle when you floss.  You can have sex (intercourse) unless your doctor tells you not to.  Do not travel far distances unless you must. Only do so with your doctor's approval.  Take prenatal classes.  Practice driving to the hospital.  Pack your hospital bag.  Prepare the baby's room.  Go to your doctor visits. Get help if:  You are not sure if you are in labor or if your water has broken.  You are dizzy.  You have mild cramps or pressure in your lower belly (abdominal).  You have a nagging pain in your belly area.  You continue to feel sick to your stomach (nauseous), throw up (vomit), or have watery poop (diarrhea).  You have bad smelling fluid coming from your vagina.  You have pain with peeing (urination). Get help right away if:  You have a fever.  You are leaking fluid from your vagina.  You are spotting or bleeding from your vagina.  You have severe belly cramping or pain.  You lose or gain weight rapidly.  You have trouble catching your breath and have chest pain.  You notice sudden or extreme puffiness (swelling) of your face, hands, ankles, feet, or legs.  You have not felt the baby move in over an hour.  You have severe headaches that do not go away with medicine.  You have vision changes. This information is not intended to replace advice given to you by your health care provider. Make   sure you discuss any questions you have with your health care provider. Document Released: 12/16/2009 Document Revised: 02/27/2016 Document Reviewed: 11/22/2012 Elsevier Interactive Patient Education  2017 [employer]  General Headache Without Cause A headache is pain or discomfort felt around the head or neck area. There are many causes and types of headaches. In some cases, the cause may not be found. Follow these instructions at home: Managing pain  Take over-the-counter and prescription medicines only as told by your doctor.  Lie down in a  dark, quiet room when you have a headache.  If directed, apply ice to the head and neck area: ? Put ice in a plastic bag. ? Place a towel between your skin and the bag. ? Leave the ice on for 20 minutes, 2-3 times per day.  Use a heating pad or hot shower to apply heat to the head and neck area as told by your doctor.  Keep lights dim if bright lights bother you or make your headaches worse. Eating and drinking  Eat meals on a regular schedule.  Lessen how much alcohol you drink.  Lessen how much caffeine you drink, or stop drinking caffeine. General instructions  Keep all follow-up visits as told by your doctor. This is important.  Keep a journal to find out if certain things bring on headaches. For example, write down: ? What you eat and drink. ? How much sleep you get. ? Any change to your diet or medicines.  Relax by getting a massage or doing other relaxing activities.  Lessen stress.  Sit up straight. Do not tighten (tense) your muscles.  Do not use tobacco products. This includes cigarettes, chewing tobacco, or e-cigarettes. If you need help quitting, ask your doctor.  Exercise regularly as told by your doctor.  Get enough sleep. This often means 7-9 hours of sleep. Contact a doctor if:  Your symptoms are not helped by medicine.  You have a headache that feels different than the other headaches.  You feel sick to your stomach (nauseous) or you throw up (vomit).  You have a fever. Get help right away if:  Your headache becomes really bad.  You keep throwing up.  You have a stiff neck.  You have trouble seeing.  You have trouble speaking.  You have pain in the eye or ear.  Your muscles are weak or you lose muscle control.  You lose your balance or have trouble walking.  You feel like you will pass out (faint) or you pass out.  You have confusion. This information is not intended to replace advice given to you by your health care provider. Make  sure you discuss any questions you have with your health care provider. Document Released: 06/30/2008 Document Revised: 02/27/2016 Document Reviewed: 01/14/2015 Elsevier Interactive Patient Education  2018 Elsevier Inc.  

## 2018-09-20 NOTE — Progress Notes (Signed)
   PRENATAL VISIT NOTE  Subjective:  Stacey Wyatt is a [age] y.o. G2P0010 at 7420w5d being seen today for ongoing prenatal care.  She is currently monitored for the following issues for this low-risk pregnancy and has Asthma; Seasonal allergies; Allergy; Syncope; Faintness; Supervision of other normal pregnancy, antepartum; Deviated nasal septum; Nasal polyposis; Chronic pansinusitis; and Allergic fungal sinusitis (AFS) on their problem list.  Patient reports headache. Has had it all week .  Tried Tylenol once without relief   No hx of migraines Contractions: Irritability. Vag. Bleeding: Scant.  Movement: Present. Denies leaking of fluid.   The following portions of the patient's history were reviewed and updated as appropriate: allergies, current medications, past family history, past medical history, past social history, past surgical history and problem list. Problem list updated.  Objective:   Vitals:   09/20/18 0924  BP: (!) 112/59  Pulse: 95  Weight: 92 kg    Fetal Status: Fetal Heart Rate (bpm): 153 Fundal Height: 32 cm Movement: Present     General:  Alert, oriented and cooperative. Patient is in no acute distress.  Skin: Skin is warm and dry. No rash noted.   Cardiovascular: Normal heart rate noted  Respiratory: Normal respiratory effort, no problems with respiration noted  Abdomen: Soft, gravid, appropriate for gestational age.  Pain/Pressure: Present     Pelvic: Cervical exam deferred        Extremities: Normal range of motion.  Edema: Trace  Mental Status: Normal mood and affect. Normal behavior. Normal judgment and thought content.   Assessment and Plan:  Pregnancy: G2P0010 at 3420w5d  1. Supervision of other normal pregnancy, antepartum       2. Acute nonintractable headache, unspecified headache type    Will try Fioricet   If it does not help, she is to call back, might try Tramadol at that point  Preterm labor symptoms and general obstetric precautions including  but not limited to vaginal bleeding, contractions, leaking of fluid and fetal movement were reviewed in detail with the patient. Please refer to After Visit Summary for other counseling recommendations.  [facility] (around [facility]04/2018[facility]  Future Appointments  Date Time Provider Department Center  10/04/2018  9:15 AM Raelyn Moraawson, Rolitta, CNM [facility] None    Wynelle BourgeoisMarie Zollie Ellery, CNM

## 2018-10-04 ENCOUNTER — Encounter: Payer: Self-pay | Admitting: Obstetrics and Gynecology

## 2018-10-04 ENCOUNTER — Ambulatory Visit (INDEPENDENT_AMBULATORY_CARE_PROVIDER_SITE_OTHER): Payer: Managed Care, Other (non HMO) | Admitting: Obstetrics and Gynecology

## 2018-10-04 VITALS — BP 120/69 | HR 111 | Wt 200.1 lb

## 2018-10-04 DIAGNOSIS — Z3483 Encounter for supervision of other normal pregnancy, third trimester: Secondary | ICD-10-CM

## 2018-10-04 DIAGNOSIS — Z3480 Encounter for supervision of other normal pregnancy, unspecified trimester: Secondary | ICD-10-CM

## 2018-10-04 NOTE — Patient Instructions (Signed)
You should prepare to be swabbed for group beta strep and have your cervix checked next week.

## 2018-10-04 NOTE — Progress Notes (Signed)
   PRENATAL VISIT NOTE  Subjective:  Stacey Wyatt is a 22 y.o. G2P0010 at 4074w5d being seen today for ongoing prenatal care.  She is currently monitored for the following issues for this low-risk pregnancy and has Asthma; Seasonal allergies; Allergy; Syncope; Faintness; Supervision of other normal pregnancy, antepartum; Deviated nasal septum; Nasal polyposis; Chronic pansinusitis; and Allergic fungal sinusitis (AFS) on their problem list.  Patient reports "fainted" while sitting at desk at work (one episode). Swelling in LT groin area resolved later the day she messaged about it. She was only concerned that it was a sign of infection when she has not been sexually active the entire pregnancy and she practices good hygiene..  Contractions: Not present. Vag. Bleeding: None.  Movement: Present. Denies leaking of fluid.   The following portions of the patient's history were reviewed and updated as appropriate: allergies, current medications, past family history, past medical history, past social history, past surgical history and problem list. Problem list updated.  Objective:   Vitals:   10/04/18 0923  BP: 120/69  Pulse: (!) 111  Weight: 200 lb 1.3 oz (90.8 kg)    Fetal Status: Fetal Heart Rate (bpm): 135 Fundal Height: 35 cm Movement: Present     General:  Alert, oriented and cooperative. Patient is in no acute distress.  Skin: Skin is warm and dry. No rash noted.   Cardiovascular: Normal heart rate noted  Respiratory: Normal respiratory effort, no problems with respiration noted  Abdomen: Soft, gravid, appropriate for gestational age.  Pain/Pressure: Present     Pelvic: Cervical exam deferred      No swabs collected after speaking with patient about complaints  Extremities: Normal range of motion.  Edema: Trace  Mental Status: Normal mood and affect. Normal behavior. Normal judgment and thought content.   Assessment and Plan:  Pregnancy: G2P0010 at 2974w5d  1. Supervision of other  normal pregnancy, antepartum - Reassurance given that lymph nodes do not appear to be swollen today. Perhaps her body was fighting off a cold/infection which caused lymph nodes to swell.  - Advised to increase daily water intake to ensure proper hydration and reduction of "syncopal" episodes. - Anticipatory guidance given for GBS and VE nv.  Preterm labor symptoms and general obstetric precautions including but not limited to vaginal bleeding, contractions, leaking of fluid and fetal movement were reviewed in detail with the patient. Please refer to After Visit Summary for other counseling recommendations.  Return in about 1 week (around 10/11/2018) for Return OB w/GBS.  Future Appointments  Date Time Provider Department Center  10/11/2018  8:45 AM Aviva SignsWilliams, Marie L, CNM [facility] None    Raelyn Moraolitta Irena Gaydos, CNM

## 2018-10-05 NOTE — L&D Delivery Note (Signed)
Delivery Note At 9:27 AM a viable female was delivered via Vaginal, Spontaneous (Presentation:LOT ;LOA  ).  APGAR: 9, 9; weight pending3355gstatus:intact , delivered with gentle traction and maternal effort .  Cord:three vessels  with the following complications: none .  Cord pH: N/A  Anesthesia:  Epidural plus lidocaine for perineal repair Episiotomy: None Lacerations: 2nd degree;Perineal Suture Repair: 3.0 vicryl Q Blood Loss (mL):  323  Mom to postpartum.  Baby to Couplet care / Skin to Skin. Patient is undecided about taking possession of placenta at time of transfer to Postpartum. Placenta to L&D if patient does not elect to keep it.  Calvert Cantor, CNM 11/02/2018, 11:25 AM

## 2018-10-11 ENCOUNTER — Encounter: Payer: Self-pay | Admitting: Advanced Practice Midwife

## 2018-10-11 ENCOUNTER — Other Ambulatory Visit (HOSPITAL_COMMUNITY)
Admission: RE | Admit: 2018-10-11 | Discharge: 2018-10-11 | Disposition: A | Discharge status: Home / Self Care | Payer: Managed Care, Other (non HMO) | Source: Ambulatory Visit | Attending: Advanced Practice Midwife | Admitting: Advanced Practice Midwife

## 2018-10-11 ENCOUNTER — Ambulatory Visit (INDEPENDENT_AMBULATORY_CARE_PROVIDER_SITE_OTHER): Payer: Managed Care, Other (non HMO) | Admitting: Advanced Practice Midwife

## 2018-10-11 VITALS — BP 121/75 | HR 101 | Wt 205.0 lb

## 2018-10-11 DIAGNOSIS — Z3480 Encounter for supervision of other normal pregnancy, unspecified trimester: Secondary | ICD-10-CM | POA: Insufficient documentation

## 2018-10-11 DIAGNOSIS — Z3483 Encounter for supervision of other normal pregnancy, third trimester: Secondary | ICD-10-CM

## 2018-10-11 LAB — OB RESULTS CONSOLE GC/CHLAMYDIA: Gonorrhea: NEGATIVE

## 2018-10-11 LAB — OB RESULTS CONSOLE GBS: GBS: NEGATIVE

## 2018-10-11 NOTE — Progress Notes (Signed)
   PRENATAL VISIT NOTE  Subjective:  Stacey Wyatt is a 23 y.o. G2P0010 at [redacted]w[redacted]d being seen today for ongoing prenatal care.  She is currently monitored for the following issues for this low-risk pregnancy and has Asthma; Seasonal allergies; Allergy; Syncope; Faintness; Supervision of other normal pregnancy, antepartum; Deviated nasal septum; Nasal polyposis; Chronic pansinusitis; and Allergic fungal sinusitis (AFS) on their problem list.  Patient reports occasional contractions.  Contractions: Irritability. Vag. Bleeding: None.  Movement: Present. Denies leaking of fluid.   The following portions of the patient's history were reviewed and updated as appropriate: allergies, current medications, past family history, past medical history, past social history, past surgical history and problem list. Problem list updated.  Objective:   Vitals:   10/11/18 0847  BP: 121/75  Pulse: (!) 101  Weight: 93 kg    Fetal Status: Fetal Heart Rate (bpm): 130   Movement: Present     General:  Alert, oriented and cooperative. Patient is in no acute distress.  Skin: Skin is warm and dry. No rash noted.   Cardiovascular: Normal heart rate noted  Respiratory: Normal respiratory effort, no problems with respiration noted  Abdomen: Soft, gravid, appropriate for gestational age.  Pain/Pressure: Present     Pelvic: Cervical exam performed        Cx 2/80%/-2/vertex   Extremities: Normal range of motion.  Edema: Trace  Mental Status: Normal mood and affect. Normal behavior. Normal judgment and thought content.   Assessment and Plan:  Pregnancy: G2P0010 at [redacted]w[redacted]d  1. Supervision of other normal pregnancy, antepartum     Discussed cervical dilation and effacement      Discussed labor signs/ROM signs and what to do     GBS and cultures done  Term labor symptoms and general obstetric precautions including but not limited to vaginal bleeding, contractions, leaking of fluid and fetal movement were reviewed in  detail with the patient. Please refer to After Visit Summary for other counseling recommendations.  Return in about 1 week (around 10/18/2018) for [facility].   Wynelle Bourgeois, CNM

## 2018-10-11 NOTE — Patient Instructions (Signed)
Third Trimester of Pregnancy  The third trimester is from week 28 through week 40 (months 7 through 9). This trimester is when your unborn baby (fetus) is growing very fast. At the end of the ninth month, the unborn baby is about 20 inches in length. It weighs about 6-10 pounds. Follow these instructions at home: Medicines  Take over-the-counter and prescription medicines only as told by your doctor. Some medicines are safe and some medicines are not safe during pregnancy.  Take a prenatal vitamin that contains at least 600 micrograms (mcg) of folic acid.  If you have trouble pooping (constipation), take medicine that will make your stool soft (stool softener) if your doctor approves. Eating and drinking   Eat regular, healthy meals.  Avoid raw meat and uncooked cheese.  If you get low calcium from the food you eat, talk to your doctor about taking a daily calcium supplement.  Eat four or five small meals rather than three large meals a day.  Avoid foods that are high in fat and sugars, such as fried and sweet foods.  To prevent constipation: ? Eat foods that are high in fiber, like fresh fruits and vegetables, whole grains, and beans. ? Drink enough fluids to keep your pee (urine) clear or pale yellow. Activity  Exercise only as told by your doctor. Stop exercising if you start to have cramps.  Avoid heavy lifting, wear low heels, and sit up straight.  Do not exercise if it is too hot, too humid, or if you are in a place of great height (high altitude).  You may continue to have sex unless your doctor tells you not to. Relieving pain and discomfort  Wear a good support bra if your breasts are tender.  Take frequent breaks and rest with your legs raised if you have leg cramps or low back pain.  Take warm water baths (sitz baths) to soothe pain or discomfort caused by hemorrhoids. Use hemorrhoid cream if your doctor approves.  If you develop puffy, bulging veins (varicose  veins) in your legs: ? Wear support hose or compression stockings as told by your doctor. ? Raise (elevate) your feet for 15 minutes, 3-4 times a day. ? Limit salt in your food. Safety  Wear your seat belt when driving.  Make a list of emergency phone numbers, including numbers for family, friends, the hospital, and police and fire departments. Preparing for your baby's arrival To prepare for the arrival of your baby:  Take prenatal classes.  Practice driving to the hospital.  Visit the hospital and tour the maternity area.  Talk to your work about taking leave once the baby comes.  Pack your hospital bag.  Prepare the baby's room.  Go to your doctor visits.  Buy a rear-facing car seat. Learn how to install it in your car. General instructions  Do not use hot tubs, steam rooms, or saunas.  Do not use any products that contain nicotine or tobacco, such as cigarettes and e-cigarettes. If you need help quitting, ask your doctor.  Do not drink alcohol.  Do not douche or use tampons or scented sanitary pads.  Do not cross your legs for long periods of time.  Do not travel for long distances unless you must. Only do so if your doctor says it is okay.  Visit your dentist if you have not gone during your pregnancy. Use a soft toothbrush to brush your teeth. Be gentle when you floss.  Avoid cat litter boxes and soil   used by cats. These carry germs that can cause birth defects in the baby and can cause a loss of your baby (miscarriage) or stillbirth.  Keep all your prenatal visits as told by your doctor. This is important. Contact a doctor if:  You are not sure if you are in labor or if your water has broken.  You are dizzy.  You have mild cramps or pressure in your lower belly.  You have a nagging pain in your belly area.  You continue to feel sick to your stomach, you throw up, or you have watery poop.  You have bad smelling fluid coming from your vagina.  You have  pain when you pee. Get help right away if:  You have a fever.  You are leaking fluid from your vagina.  You are spotting or bleeding from your vagina.  You have severe belly cramps or pain.  You lose or gain weight quickly.  You have trouble catching your breath and have chest pain.  You notice sudden or extreme puffiness (swelling) of your face, hands, ankles, feet, or legs.  You have not felt the baby move in over an hour.  You have severe headaches that do not go away with medicine.  You have trouble seeing.  You are leaking, or you are having a gush of fluid, from your vagina before you are 37 weeks.  You have regular belly spasms (contractions) before you are 37 weeks. Summary  The third trimester is from week 28 through week 40 (months 7 through 9). This time is when your unborn baby is growing very fast.  Follow your doctor's advice about medicine, food, and activity.  Get ready for the arrival of your baby by taking prenatal classes, getting all the baby items ready, preparing the baby's room, and visiting your doctor to be checked.  Get help right away if you are bleeding from your vagina, or you have chest pain and trouble catching your breath, or if you have not felt your baby move in over an hour. This information is not intended to replace advice given to you by your health care provider. Make sure you discuss any questions you have with your health care provider. Document Released: 12/16/2009 Document Revised: 10/27/2016 Document Reviewed: 10/27/2016 Elsevier Interactive Patient Education  2019 [employer]. Tests and Screening During Pregnancy Having certain tests and screenings during pregnancy is an important part of your prenatal care. These tests help your health care provider find problems that might affect your pregnancy. Some tests are done for all pregnant women, and some are optional. Most of the tests and screenings do not pose any risks for you or  your baby. You may need additional testing if any routine tests indicate a problem. Tests and screenings done in early pregnancy Some tests and screenings you can expect to have in early pregnancy include:  Blood tests, such as: ? Complete blood count (CBC). This test is done to check your red and white blood cells. It can help identify a risk for anemia, infection, or bleeding. ? Blood typing. This test determines your blood type as well as whether you have a certain protein in your red blood cells (Rh factor). If you do not have this protein (Rh negative) and your baby does have it (Rh positive), your body could make antibodies to the Rh factor. This could be dangerous to your baby's health. ? Tests to check for diseases that can cause birth defects or can be passed to  your baby, such as:  [place] measles (rubella). The test indicates whether you are immune to rubella.  Hepatitis B and C. All women are tested for hepatitis B. You may also be tested for hepatitis C if you have risk factors for the condition.  Zika virus infection. You may have a blood or urine test to check for this infection if you or your partner has traveled to an area where the virus occurs.  Urine testing. A urine sample can be tested for diabetes, protein in your urine, and signs of infection.  Testing for sexually transmitted infections (STIs), such as HIV, syphilis, and chlamydia.  Testing for tuberculosis. You may have this skin test if you are at risk for tuberculosis.  Fetal ultrasound. This is an imaging study of your developing baby. It is done using sound waves and a computer. This test may be done at 11-14 weeks to confirm your pregnancy and help determine your due date. Tests and screenings done later in pregnancy Certain tests are done for the first time during later pregnancy. In addition, some of the tests that were done in early pregnancy are repeated at this time. Some common tests you can expect to have  later in pregnancy include:  Rh antibody testing. If you are Rh negative, you will have a blood test at about 28 weeks of pregnancy to see if you are producing Rh antibodies. If you have not started to make antibodies, you will be given an injection to prevent you from making antibodies for the rest of your pregnancy.  Glucose screening. This tests your blood sugar to find out whether you are developing the type of diabetes that occurs during pregnancy (gestational diabetes). You may have this screening earlier if you have risk factors for diabetes.  Screening for group B streptococcus (GBS). GBS is a type of bacteria that may live in your rectum or vagina. You may have GBS without any symptoms. GBS can spread to your baby during birth. This test involves doing a rectal and vaginal swab at 35-37 weeks of pregnancy. If testing is positive for GBS, you may be treated with antibiotic medicine.  CBC to check for anemia and blood-clotting ability.  Urine tests to check for protein, which can be a sign of a condition called preeclampsia.  Fetal ultrasound. This may be repeated at 16-20 weeks to check how your baby is growing and developing. Screening for birth defects Some birth defects are caused by abnormal genes passed down through families. Early in your pregnancy, tests can be done to find out if your baby is at risk for a genetic disorder. This testing is optional. The type of testing recommended for you will depend on your family and medical history, your ethnicity, and your age. Testing may include:  Screening tests. These tests may include an ultrasound, blood tests, or a combination of both. The blood tests are used to check for abnormal genes, and the ultrasound is done to look for early birth defects.  Carrier screening. This test involves checking the blood or saliva of both parents to see if they carry abnormal genes that could be passed down to a baby. If genetic screening shows that your  baby is at risk for a genetic defect, additional diagnostic testing may be recommended, such as:  Amniocentesis. This involves testing a sample of fluid from your womb (amniotic fluid).  Chorionic villus sampling. In this test, a sample of cells from your placenta is checked for abnormal cells. Unlike  other tests done during pregnancy, diagnostic testing does have some risk for your pregnancy. Talk to your health care provider about the risks and benefits of genetic testing. Where to find more information  American Pregnancy Association: americanpregnancy.org/prenatal-testing  Office on Women's Health: MightyReward.co.nz  March of Dimes: marchofdimes.org/pregnancy Questions to ask your health care provider  What routine tests are recommended for me?  When and how will these tests be done?  When will I get the results of routine tests?  What do the results of these tests mean for me or my baby?  Do you recommend any genetic screening tests? Which ones?  Should I see a genetic counselor before having genetic screening? Summary  Having tests and screenings during pregnancy is an important part of your prenatal care.  In early pregnancy, testing may be done to check blood type, Rh status, and risks for various conditions that can affect your baby.  Fetal ultrasound may be done in early pregnancy to confirm a pregnancy and later to look for any birth defects.  Later in pregnancy, tests may include screening for GBS and gestational diabetes.  Genetic testing is optional. Consider talking to a genetic counselor about this testing. This information is not intended to replace advice given to you by your health care provider. Make sure you discuss any questions you have with your health care provider. Document Released: 12/06/2017 Document Revised: 12/06/2017 Document Reviewed: 12/06/2017 Elsevier Interactive Patient Education  2019 [employer].

## 2018-10-12 LAB — GC/CHLAMYDIA PROBE AMP (~~LOC~~) NOT AT ARMC
Chlamydia: NEGATIVE
Neisseria Gonorrhea: NEGATIVE

## 2018-10-15 LAB — CULTURE, BETA STREP (GROUP B ONLY): Strep Gp B Culture: NEGATIVE

## 2018-10-18 ENCOUNTER — Ambulatory Visit (INDEPENDENT_AMBULATORY_CARE_PROVIDER_SITE_OTHER): Payer: Managed Care, Other (non HMO) | Admitting: Advanced Practice Midwife

## 2018-10-18 ENCOUNTER — Encounter: Payer: Self-pay | Admitting: Advanced Practice Midwife

## 2018-10-18 VITALS — BP 113/69 | HR 92 | Wt 206.0 lb

## 2018-10-18 DIAGNOSIS — Z3480 Encounter for supervision of other normal pregnancy, unspecified trimester: Secondary | ICD-10-CM

## 2018-10-18 NOTE — Patient Instructions (Signed)

## 2018-10-18 NOTE — Progress Notes (Signed)
   PRENATAL VISIT NOTE  Subjective:  Stacey Wyatt is a 23 y.o. G2P0010 at [redacted]w[redacted]d being seen today for ongoing prenatal care.  She is currently monitored for the following issues for this low-risk pregnancy and has Asthma; Seasonal allergies; Allergy; Syncope; Faintness; Supervision of other normal pregnancy, antepartum; Deviated nasal septum; Nasal polyposis; Chronic pansinusitis; and Allergic fungal sinusitis (AFS) on their problem list.  Patient reports occasional contractions.  Contractions: Irritability. Vag. Bleeding: None.  Movement: Present. Denies leaking of fluid.   The following portions of the patient's history were reviewed and updated as appropriate: allergies, current medications, past family history, past medical history, past social history, past surgical history and problem list. Problem list updated.  Objective:   Vitals:   10/18/18 0927  BP: 113/69  Pulse: 92  Weight: 93.4 kg    Fetal Status: Fetal Heart Rate (bpm): 135 Fundal Height: 37 cm Movement: Present  Presentation: Vertex  General:  Alert, oriented and cooperative. Patient is in no acute distress.  Skin: Skin is warm and dry. No rash noted.   Cardiovascular: Normal heart rate noted  Respiratory: Normal respiratory effort, no problems with respiration noted  Abdomen: Soft, gravid, appropriate for gestational age.  Pain/Pressure: Present     Pelvic: Cervical exam performed   Dilation: 2.5 Effacement (%): 70 Station: -2, -1  Extremities: Normal range of motion.  Edema: Trace  Mental Status: Normal mood and affect. Normal behavior. Normal judgment and thought content.   Assessment and Plan:  Pregnancy: G2P0010 at [redacted]w[redacted]d  1. Supervision of other normal pregnancy, antepartum     Reviewed signs of labor     Reviewed where to go for labor signs.      Group B Strep is Negative  Term labor symptoms and general obstetric precautions including but not limited to vaginal bleeding, contractions, leaking of fluid  and fetal movement were reviewed in detail with the patient. Please refer to After Visit Summary for other counseling recommendations.  Return in about 1 week (around 10/25/2018) for [facility].  Future Appointments  Date Time Provider Department Center  10/31/2018  8:15 AM Willodean Rosenthal, MD [facility] None  11/08/2018  8:15 AM Aviva Signs, CNM [facility] None    Wynelle Bourgeois, CNM

## 2018-10-25 ENCOUNTER — Encounter (HOSPITAL_COMMUNITY): Payer: Self-pay

## 2018-10-25 ENCOUNTER — Other Ambulatory Visit: Payer: Self-pay

## 2018-10-25 ENCOUNTER — Inpatient Hospital Stay (HOSPITAL_COMMUNITY)
Admission: AD | Admit: 2018-10-25 | Discharge: 2018-10-25 | Disposition: A | Discharge status: Home / Self Care | Payer: Managed Care, Other (non HMO) | Source: Ambulatory Visit | Attending: Family Medicine | Admitting: Family Medicine

## 2018-10-25 DIAGNOSIS — O471 False labor at or after 37 completed weeks of gestation: Secondary | ICD-10-CM

## 2018-10-25 DIAGNOSIS — Z3A38 38 weeks gestation of pregnancy: Secondary | ICD-10-CM

## 2018-10-25 DIAGNOSIS — Z3480 Encounter for supervision of other normal pregnancy, unspecified trimester: Secondary | ICD-10-CM

## 2018-10-25 NOTE — MAU Note (Signed)
Started this morning. Contracting every 20-30 min.  No bleeding or leaking.small amt of mucous d/c. Was 2+ at last exam

## 2018-10-25 NOTE — MAU Provider Note (Signed)
Obstetric Attending MAU Note  Chief Complaint:  Contractions   First Provider Initiated Contact with Patient 10/25/18 1938     HPI: Stacey Wyatt is a 23 y.o. G2P0010 at [redacted]w[redacted]d who presents to maternity admissions reporting contractions that are painful at home q 20-30 minutes. Was 2 cm in office last week. She has anxiety and is nervous about being in labor. Denies contractions, leakage of fluid or vaginal bleeding. Good fetal movement.   Pregnancy Course: Receives care at [facility] Patient Active Problem List   Diagnosis Date Noted  . Supervision of other normal pregnancy, antepartum 03/16/2018  . Faintness 10/11/2015  . Seasonal allergies   . Allergy   . Syncope   . Asthma 03/11/2015  . Deviated nasal septum 12/25/2014  . Nasal polyposis 12/25/2014  . Chronic pansinusitis 12/25/2014  . Allergic fungal sinusitis (AFS) 12/25/2014    Past Medical History:  Diagnosis Date  . Allergy   . Asthma   . Seasonal allergies   . Syncope     OB History  Gravida Para Term Preterm AB Living  2       1    SAB TAB Ectopic Multiple Live Births  1            # Outcome Date GA Lbr Len/2nd Weight Sex Delivery Anes PTL Lv  2 Current           1 SAB 12/21/16 [redacted]w[redacted]d           Past Surgical History:  Procedure Laterality Date  . NASAL POLYP SURGERY Bilateral     Family History: Family History  Problem Relation Age of Onset  . Hypertension Mother   . Allergic rhinitis Father     Social History: Social History   Tobacco Use  . Smoking status: Never Smoker  . Smokeless tobacco: Never Used  Substance Use Topics  . Alcohol use: No    Alcohol/week: 0.0 standard drinks  . Drug use: No    Allergies: No Known Allergies  Medications Prior to Admission  Medication Sig Dispense Refill Last Dose  . albuterol (PROVENTIL HFA;VENTOLIN HFA) 108 (90 Base) MCG/ACT inhaler Inhale 2 puffs into the lungs every 6 (six) hours as needed for wheezing or shortness of breath. 18 g 5 Past Week at  Unknown time  . beclomethasone (QVAR REDIHALER) 40 MCG/ACT inhaler 2 puffs as needed for wheezing. 1 Inhaler 0 Past Week at Unknown time  . calcium carbonate (TUMS - DOSED IN MG ELEMENTAL CALCIUM) 500 MG chewable tablet Chew 1 tablet by mouth daily as needed for indigestion or heartburn.   Past Week at Unknown time  . fluticasone (FLONASE) 50 MCG/ACT nasal spray Place 2 sprays into both nostrils daily. 48 g 0 Past Week at Unknown time  . loratadine (CLARITIN) 10 MG tablet Take 1 tablet (10 mg total) by mouth daily. 90 tablet 3 Past Week at Unknown time  . Prenat-FeAsp-Meth-FA-DHA w/o A (PRENATE PIXIE) 10-0.6-0.4-200 MG CAPS Take 1 capsule by mouth daily. 30 capsule 12 10/25/2018 at Unknown time  . Butalbital-APAP-Caffeine 50-325-40 MG capsule Take 1-2 capsules by mouth every 6 (six) hours as needed for headache. 30 capsule 3 More than a month at Unknown time  . famotidine (PEPCID) 20 MG tablet Take 1 tablet (20 mg total) by mouth 2 (two) times daily. (Patient not taking: Reported on 09/20/2018) 60 tablet 3 Not Taking    ROS: Pertinent findings in history of present illness.  Physical Exam  Blood pressure 116/79, pulse 94, temperature  98.1 F (36.7 C), temperature source Oral, resp. rate 18, weight 94.1 kg, last menstrual period 01/27/2018, SpO2 100 %, unknown if currently breastfeeding. CONSTITUTIONAL: Well-developed, well-nourished female in no acute distress.  HENT:  Normocephalic, atraumatic, External right and left ear normal. Oropharynx is clear and moist EYES: Conjunctivae and EOM are normal. No scleral icterus.  NECK: Normal range of motion, supple, no masses SKIN: Skin is warm and dry. No rash noted. Not diaphoretic. No erythema. No pallor. NEUROLGIC: Alert and oriented to person, place, and time.  PSYCHIATRIC: Normal mood and affect. Normal behavior. Normal judgment and thought content. CARDIOVASCULAR: Normal heart rate noted, regular rhythm RESPIRATORY: Effort and breath sounds  normal, no problems with respiration noted ABDOMEN: Soft, nontender, nondistended, gravid appropriate for gestational age MUSCULOSKELETAL: Normal range of motion. No edema and no tenderness. 2+ distal pulses.  SPECULUM EXAM: NEFG, physiologic discharge, no blood, cervix clean Dilation: 2.5 Effacement (%): 70 Presentation: Vertex Exam by:: Chauncy Lean, RN  FHT:  Baseline 130 , moderate variability, accelerations present, no decelerations Contractions: q 10 mins   MAU Course: No progression of labor  Assessment: 1. False labor after 37 completed weeks of gestation   2. Supervision of other normal pregnancy, antepartum     Plan: Discharge home Labor precautions and fetal kick counts reviewed Follow up with OB provider  Follow-up Information    [facility] Follow up.   Specialty:  Obstetrics and Gynecology Why:  keep next scheduled appointment Contact information: [address] [place] [address] [phone]          Allergies as of 10/25/2018   No Known Allergies     Medication List    TAKE these medications   albuterol 108 (90 Base) MCG/ACT inhaler Commonly known as:  PROVENTIL HFA;VENTOLIN HFA Inhale 2 puffs into the lungs every 6 (six) hours as needed for wheezing or shortness of breath.   beclomethasone 40 MCG/ACT inhaler Commonly known as:  QVAR REDIHALER 2 puffs as needed for wheezing.   Butalbital-APAP-Caffeine 50-325-40 MG capsule Take 1-2 capsules by mouth every 6 (six) hours as needed for headache.   calcium carbonate 500 MG chewable tablet Commonly known as:  TUMS - dosed in mg elemental calcium Chew 1 tablet by mouth daily as needed for indigestion or heartburn.   famotidine 20 MG tablet Commonly known as:  PEPCID Take 1 tablet (20 mg total) by mouth 2 (two) times daily.   fluticasone 50 MCG/ACT nasal spray Commonly known as:  FLONASE Place 2 sprays into  both nostrils daily.   loratadine 10 MG tablet Commonly known as:  CLARITIN Take 1 tablet (10 mg total) by mouth daily.   PRENATE PIXIE 10-0.6-0.4-200 MG Caps Take 1 capsule by mouth daily.       Reva Bores, MD 10/25/2018 7:42 PM

## 2018-10-25 NOTE — Progress Notes (Signed)
I have communicated with Dr. Shawnie Pons and reviewed vital signs:  Vitals:   10/25/18 1919 10/25/18 1950  BP: 116/79 122/85  Pulse: 94 90  Resp: 18 18  Temp: 98.1 F (36.7 C) (!) 97.4 F (36.3 C)  SpO2:      Vaginal exam:  Dilation: 2.5 Effacement (%): 70 Presentation: Vertex Exam by:: [employer], RN,   Also reviewed contraction pattern and that non-stress test is reactive.  It has been documented that patient is contracting every 20-30 minutes, per pt, with no cervical change since office visit last week, not indicating active labor.  Patient denies any other complaints.  Based on this report provider has given order for discharge.  A discharge order and diagnosis entered by a provider.   Labor discharge instructions reviewed with patient.

## 2018-10-25 NOTE — Discharge Instructions (Signed)
Braxton Hicks Contractions Contractions of the uterus can occur throughout pregnancy, but they are not always a sign that you are in labor. You may have practice contractions called Braxton Hicks contractions. These false labor contractions are sometimes confused with true labor. What are Braxton Hicks contractions? Braxton Hicks contractions are tightening movements that occur in the muscles of the uterus before labor. Unlike true labor contractions, these contractions do not result in opening (dilation) and thinning of the cervix. Toward the end of pregnancy (32-34 weeks), Braxton Hicks contractions can happen more often and may become stronger. These contractions are sometimes difficult to tell apart from true labor because they can be very uncomfortable. You should not feel embarrassed if you go to the hospital with false labor. Sometimes, the only way to tell if you are in true labor is for your health care provider to look for changes in the cervix. The health care provider will do a physical exam and may monitor your contractions. If you are not in true labor, the exam should show that your cervix is not dilating and your water has not broken. If there are no other health problems associated with your pregnancy, it is completely safe for you to be sent home with false labor. You may continue to have Braxton Hicks contractions until you go into true labor. How to tell the difference between true labor and false labor True labor  Contractions last 30-70 seconds.  Contractions become very regular.  Discomfort is usually felt in the top of the uterus, and it spreads to the lower abdomen and low back.  Contractions do not go away with walking.  Contractions usually become more intense and increase in frequency.  The cervix dilates and gets thinner. False labor  Contractions are usually shorter and not as strong as true labor contractions.  Contractions are usually irregular.  Contractions  are often felt in the front of the lower abdomen and in the groin.  Contractions may go away when you walk around or change positions while lying down.  Contractions get weaker and are shorter-lasting as time goes on.  The cervix usually does not dilate or become thin. Follow these instructions at home:   Take over-the-counter and prescription medicines only as told by your health care provider.  Keep up with your usual exercises and follow other instructions from your health care provider.  Eat and drink lightly if you think you are going into labor.  If Braxton Hicks contractions are making you uncomfortable: ? Change your position from lying down or resting to walking, or change from walking to resting. ? Sit and rest in a tub of warm water. ? Drink enough fluid to keep your urine pale yellow. Dehydration may cause these contractions. ? Do slow and deep breathing several times an hour.  Keep all follow-up prenatal visits as told by your health care provider. This is important. Contact a health care provider if:  You have a fever.  You have continuous pain in your abdomen. Get help right away if:  Your contractions become stronger, more regular, and closer together.  You have fluid leaking or gushing from your vagina.  You pass blood-tinged mucus (bloody show).  You have bleeding from your vagina.  You have low back pain that you never had before.  You feel your baby's head pushing down and causing pelvic pressure.  Your baby is not moving inside you as much as it used to. Summary  Contractions that occur before labor are   called Braxton Hicks contractions, false labor, or practice contractions.  Braxton Hicks contractions are usually shorter, weaker, farther apart, and less regular than true labor contractions. True labor contractions usually become progressively stronger and regular, and they become more frequent.  Manage discomfort from Braxton Hicks contractions  by changing position, resting in a warm bath, drinking plenty of water, or practicing deep breathing. This information is not intended to replace advice given to you by your health care provider. Make sure you discuss any questions you have with your health care provider. Document Released: 02/04/2017 Document Revised: 07/06/2017 Document Reviewed: 02/04/2017 Elsevier Interactive Patient Education  2019 Elsevier Inc.  

## 2018-10-31 ENCOUNTER — Encounter: Payer: Self-pay | Admitting: Obstetrics & Gynecology

## 2018-10-31 ENCOUNTER — Ambulatory Visit (INDEPENDENT_AMBULATORY_CARE_PROVIDER_SITE_OTHER): Payer: Managed Care, Other (non HMO) | Admitting: Obstetrics & Gynecology

## 2018-10-31 VITALS — BP 120/78 | HR 90 | Wt 209.0 lb

## 2018-10-31 DIAGNOSIS — J4520 Mild intermittent asthma, uncomplicated: Secondary | ICD-10-CM

## 2018-10-31 DIAGNOSIS — Z3480 Encounter for supervision of other normal pregnancy, unspecified trimester: Secondary | ICD-10-CM

## 2018-10-31 DIAGNOSIS — Z3483 Encounter for supervision of other normal pregnancy, third trimester: Secondary | ICD-10-CM

## 2018-10-31 NOTE — Progress Notes (Signed)
   PRENATAL VISIT NOTE  Subjective:  Stacey Wyatt is a 23 y.o. G2P0010 at [redacted]w[redacted]d being seen today for ongoing prenatal care.  She is currently monitored for the following issues for this low-risk pregnancy and has Asthma; Seasonal allergies; Allergy; Syncope; Faintness; Supervision of other normal pregnancy, antepartum; Deviated nasal septum; Nasal polyposis; Chronic pansinusitis; and Allergic fungal sinusitis (AFS) on their problem list.  Patient reports contractions every night.  Contractions: Irregular. Vag. Bleeding: None.  Movement: Present. Denies leaking of fluid.   The following portions of the patient's history were reviewed and updated as appropriate: allergies, current medications, past family history, past medical history, past social history, past surgical history and problem list. Problem list updated.  Objective:   Vitals:   10/31/18 0822  BP: 120/78  Pulse: 90  Weight: 209 lb (94.8 kg)    Fetal Status: Fetal Heart Rate (bpm): 138   Movement: Present     General:  Alert, oriented and cooperative. Patient is in no acute distress.  Skin: Skin is warm and dry. No rash noted.   Cardiovascular: Normal heart rate noted  Respiratory: Normal respiratory effort, no problems with respiration noted  Abdomen: Soft, gravid, appropriate for gestational age.  Pain/Pressure: Present     Pelvic: Cervical exam performed        Extremities: Normal range of motion.  Edema: Trace  Mental Status: Normal mood and affect. Normal behavior. Normal judgment and thought content.   Assessment and Plan:  Pregnancy: G2P0010 at [redacted]w[redacted]d  1. Supervision of other normal pregnancy, antepartum Cervix ripe.  No indications for IOL Reviewed with pt. Scheduled for 41 weeks IOL - pt agrees    2. Mild intermittent asthma without complication stable  Term labor symptoms and general obstetric precautions including but not limited to vaginal bleeding, contractions, leaking of fluid and fetal movement  were reviewed in detail with the patient. Please refer to After Visit Summary for other counseling recommendations.  Return in about 1 week (around 11/07/2018).  Future Appointments  Date Time Provider Department Center  11/08/2018  8:15 AM Aviva Signs, CNM [facility] None    Willodean Rosenthal, MD

## 2018-10-31 NOTE — Patient Instructions (Addendum)
Natural Childbirth Natural childbirth is when labor and delivery progresses naturally with minimal medical assistance or medicines. Natural childbirth may be an option if you have a low risk pregnancy. With the help of a birthing professional such as a midwife, doula, or other health care provider, you may be able to use relaxation techniques and controlled breathing to manage pain during labor. Many women choose natural childbirth because it makes them feel more in control and in touch with the experience of giving birth. Some women also choose natural childbirth because they are concerned that medicines may affect them and their babies. What types of natural childbirth techniques are available? There are two types of natural childbirth techniques, which are taught in classes:  The Lamaze method. In these classes, parents learn that having a baby is normal, healthy, and natural. Mothers are taught to take a neutral position regarding pain medicine and numbing medicines, and to make an informed decision about using these medicines if the time comes.  The Bradley method, also called husband-coached birth. In these classes, the father or partner learns to be the birth coach. The mother is encouraged to exercise and eat a balanced, nutritious diet. Both parents also learn relaxation and deep breathing exercises and are taught how to prepare for emergency situations. What are some natural pain and relaxation techniques? If you are considering a natural childbirth, you should explore your options for managing pain and discomfort during your labor and delivery. Some natural pain and relaxation techniques include:  Meditation.  Yoga.  Hypnosis.  Acupuncture.  Massage.  Changing positions, such as by walking, rocking, showering, or leaning on birth balls.  Lying in warm water or a whirlpool bath.  Finding an activity that keeps your mind off the labor pain.  Listening to soft music.  Focusing  on a particular object (visual imagery). How can I prepare for a natural birth?  Talk with your spouse or partner about your goals for having a natural childbirth.  Decide if you will have your baby in the hospital, at a birthing center, or at home.  Have your spouse or partner attend the natural childbirth technique classes with you.  Decide which type of health care provider you would like to assist you with your delivery.  If you have other children, make plans to have someone take care of them when you go to the hospital or birthing center.  Know the distance and the time it takes to go to the hospital or birthing center. Practice going there and time it to be sure.  Have a bag packed with a nightgown, bathrobe, and toiletries. Be ready to take it with you when you go into labor.  Keep phone numbers of your family and friends handy if you need to call someone when you go into labor.  Talk with your health care provider about the possibility of a medical emergency and what will happen if that occurs. What are the advantages and disadvantages of natural childbirth? Advantages  You are in control of your labor and delivery experience.  You may be able to avoid some common medical practices, such as getting medicines or being monitored all the time.  You and your spouse or partner will work together, which can increase your bond with each other.  In most delivery centers, your family and friends can be involved in the labor and delivery process. Disadvantages  The methods of helping relieve labor pains may not work for you.  You may feel   disappointed if you change your mind during labor and decide not to have a natural childbirth. What can I expect after delivery?  You may feel very tired.  You may feel uncomfortable as your uterus contracts.  The area around your vagina will feel sore.  You may feel cold and shaky. What questions should I ask my health care provider?   Am I a good candidate for natural childbirth?  Can you refer me to classes to learn more about natural childbirth?  How do I create a birth plan that outlines my wishes for natural childbirth? Where to find more information  American Pregnancy Association: americanpregnancy.org  American Congress of Obstetricians and Gynecologists: acog.org  American College of Nurse-Midwives: www.midwife.org Summary  Natural childbirth may be an option if you have a low risk pregnancy.  The Bradley or Lamaze Methods are techniques that can assist you in achieving a natural birth experience.  Talk to your health care provider to determine if you are a good candidate for a natural childbirth. This information is not intended to replace advice given to you by your health care provider. Make sure you discuss any questions you have with your health care provider. Document Released: 09/03/2008 Document Revised: 11/30/2016 Document Reviewed: 11/30/2016 Elsevier Interactive Patient Education  2019 Elsevier Inc.  

## 2018-11-02 ENCOUNTER — Inpatient Hospital Stay (HOSPITAL_COMMUNITY)
Admission: AD | Admit: 2018-11-02 | Discharge: 2018-11-04 | DRG: 806 | Disposition: A | Discharge status: Home / Self Care | Payer: Managed Care, Other (non HMO) | Attending: Obstetrics and Gynecology | Admitting: Obstetrics and Gynecology

## 2018-11-02 ENCOUNTER — Inpatient Hospital Stay (HOSPITAL_COMMUNITY): Payer: Managed Care, Other (non HMO) | Admitting: Anesthesiology

## 2018-11-02 ENCOUNTER — Other Ambulatory Visit: Payer: Self-pay

## 2018-11-02 ENCOUNTER — Encounter (HOSPITAL_COMMUNITY): Payer: Self-pay | Admitting: *Deleted

## 2018-11-02 DIAGNOSIS — Z3A39 39 weeks gestation of pregnancy: Secondary | ICD-10-CM | POA: Diagnosis not present

## 2018-11-02 DIAGNOSIS — Z3483 Encounter for supervision of other normal pregnancy, third trimester: Secondary | ICD-10-CM | POA: Diagnosis present

## 2018-11-02 DIAGNOSIS — O4290 Premature rupture of membranes, unspecified as to length of time between rupture and onset of labor, unspecified weeks of gestation: Secondary | ICD-10-CM

## 2018-11-02 DIAGNOSIS — Z3480 Encounter for supervision of other normal pregnancy, unspecified trimester: Secondary | ICD-10-CM

## 2018-11-02 DIAGNOSIS — O9081 Anemia of the puerperium: Secondary | ICD-10-CM | POA: Diagnosis not present

## 2018-11-02 LAB — CBC
HCT: 34.7 % — ABNORMAL LOW (ref 36.0–46.0)
HCT: 28.3 % — ABNORMAL LOW (ref 36.0–46.0)
Hemoglobin: 11.7 g/dL — ABNORMAL LOW (ref 12.0–15.0)
Hemoglobin: 9.4 g/dL — ABNORMAL LOW (ref 12.0–15.0)
MCH: 29.7 pg (ref 26.0–34.0)
MCH: 29.3 pg (ref 26.0–34.0)
MCHC: 33.7 g/dL (ref 30.0–36.0)
MCHC: 33.2 g/dL (ref 30.0–36.0)
MCV: 88.1 fL (ref 80.0–100.0)
MCV: 88.2 fL (ref 80.0–100.0)
Platelets: 274 10*3/uL (ref 150–400)
Platelets: 245 10*3/uL (ref 150–400)
RBC: 3.94 MIL/uL (ref 3.87–5.11)
RBC: 3.21 MIL/uL — ABNORMAL LOW (ref 3.87–5.11)
RDW: 14 % (ref 11.5–15.5)
RDW: 14.1 % (ref 11.5–15.5)
WBC: 8.7 10*3/uL (ref 4.0–10.5)
WBC: 12.3 10*3/uL — ABNORMAL HIGH (ref 4.0–10.5)
nRBC: 0 % (ref 0.0–0.2)
nRBC: 0 % (ref 0.0–0.2)

## 2018-11-02 LAB — RPR: RPR Ser Ql: NONREACTIVE

## 2018-11-02 LAB — ABO/RH: ABO/RH(D): B POS

## 2018-11-02 MED ORDER — ONDANSETRON HCL 4 MG PO TABS: 4.0000 mg | ORAL_TABLET | ORAL | Status: DC | PRN

## 2018-11-02 MED ORDER — IBUPROFEN 600 MG PO TABS
600.0000 mg | ORAL_TABLET | Freq: Four times a day (QID) | ORAL | Status: DC
  Administered 2018-11-02 – 2018-11-04 (×5): 600 mg via ORAL
  Filled 2018-11-02 (×6): qty 1

## 2018-11-02 MED ORDER — LACTATED RINGERS IV SOLN
INTRAVENOUS | Status: DC
  Administered 2018-11-02 (×2): via INTRAVENOUS

## 2018-11-02 MED ORDER — LIDOCAINE HCL (PF) 1 % IJ SOLN
INTRAMUSCULAR | Status: DC | PRN
  Administered 2018-11-02: 6 mL via EPIDURAL

## 2018-11-02 MED ORDER — OXYCODONE-ACETAMINOPHEN 5-325 MG PO TABS: 1.0000 | ORAL_TABLET | ORAL | Status: DC | PRN

## 2018-11-02 MED ORDER — MEASLES, MUMPS & RUBELLA VAC IJ SOLR: 0.5000 mL | Freq: Once | INTRAMUSCULAR | Status: DC

## 2018-11-02 MED ORDER — METHYLERGONOVINE MALEATE 0.2 MG PO TABS
0.2000 mg | ORAL_TABLET | ORAL | Status: DC | PRN
  Administered 2018-11-02 – 2018-11-03 (×4): 0.2 mg via ORAL
  Filled 2018-11-02 (×4): qty 1

## 2018-11-02 MED ORDER — LACTATED RINGERS IV SOLN: 500.0000 mL | INTRAVENOUS | Status: DC | PRN

## 2018-11-02 MED ORDER — OXYTOCIN 40 UNITS IN NORMAL SALINE INFUSION - SIMPLE MED: 1.0000 m[IU]/min | INTRAVENOUS | Status: DC

## 2018-11-02 MED ORDER — FENTANYL 2.5 MCG/ML BUPIVACAINE 1/10 % EPIDURAL INFUSION (WH - ANES)
14.0000 mL/h | INTRAMUSCULAR | Status: DC | PRN
  Administered 2018-11-02: 14 mL/h via EPIDURAL
  Filled 2018-11-02: qty 100

## 2018-11-02 MED ORDER — SOD CITRATE-CITRIC ACID 500-334 MG/5ML PO SOLN: 30.0000 mL | ORAL | Status: DC | PRN

## 2018-11-02 MED ORDER — OXYCODONE-ACETAMINOPHEN 5-325 MG PO TABS: 2.0000 | ORAL_TABLET | ORAL | Status: DC | PRN

## 2018-11-02 MED ORDER — DIPHENHYDRAMINE HCL 50 MG/ML IJ SOLN: 12.5000 mg | INTRAMUSCULAR | Status: DC | PRN

## 2018-11-02 MED ORDER — FERROUS SULFATE 325 (65 FE) MG PO TABS
325.0000 mg | ORAL_TABLET | ORAL | Status: DC
  Administered 2018-11-04: 325 mg via ORAL
  Filled 2018-11-02: qty 1

## 2018-11-02 MED ORDER — OXYTOCIN 40 UNITS IN NORMAL SALINE INFUSION - SIMPLE MED
2.5000 [IU]/h | INTRAVENOUS | Status: DC
  Administered 2018-11-02: 2.5 [IU]/h via INTRAVENOUS
  Filled 2018-11-02 (×2): qty 1000

## 2018-11-02 MED ORDER — FLEET ENEMA 7-19 GM/118ML RE ENEM: 1.0000 | ENEMA | RECTAL | Status: DC | PRN

## 2018-11-02 MED ORDER — MISOPROSTOL 200 MCG PO TABS: 800.0000 ug | ORAL_TABLET | Freq: Once | ORAL | Status: DC

## 2018-11-02 MED ORDER — ACETAMINOPHEN 325 MG PO TABS
650.0000 mg | ORAL_TABLET | ORAL | Status: DC | PRN
  Administered 2018-11-02: 650 mg via ORAL
  Filled 2018-11-02 (×2): qty 2

## 2018-11-02 MED ORDER — PHENYLEPHRINE 40 MCG/ML (10ML) SYRINGE FOR IV PUSH (FOR BLOOD PRESSURE SUPPORT)
80.0000 ug | PREFILLED_SYRINGE | INTRAVENOUS | Status: DC | PRN
  Filled 2018-11-02 (×2): qty 10

## 2018-11-02 MED ORDER — TRANEXAMIC ACID-NACL 1000-0.7 MG/100ML-% IV SOLN
1000.0000 mg | Freq: Once | INTRAVENOUS | Status: AC
  Administered 2018-11-02: 1000 mg via INTRAVENOUS
  Filled 2018-11-02: qty 100

## 2018-11-02 MED ORDER — DIPHENHYDRAMINE HCL 25 MG PO CAPS: 25.0000 mg | ORAL_CAPSULE | Freq: Four times a day (QID) | ORAL | Status: DC | PRN

## 2018-11-02 MED ORDER — HYDROXYZINE HCL 50 MG PO TABS
50.0000 mg | ORAL_TABLET | Freq: Four times a day (QID) | ORAL | Status: DC | PRN
  Filled 2018-11-02: qty 1

## 2018-11-02 MED ORDER — METHYLERGONOVINE MALEATE 0.2 MG/ML IJ SOLN
0.2000 mg | INTRAMUSCULAR | Status: DC | PRN
  Administered 2018-11-02: 0.2 mg via INTRAMUSCULAR
  Filled 2018-11-02: qty 1

## 2018-11-02 MED ORDER — FENTANYL CITRATE (PF) 100 MCG/2ML IJ SOLN
50.0000 ug | INTRAMUSCULAR | Status: DC | PRN
  Administered 2018-11-02: 100 ug via INTRAVENOUS
  Filled 2018-11-02: qty 2

## 2018-11-02 MED ORDER — WITCH HAZEL-GLYCERIN EX PADS: 1.0000 "application " | MEDICATED_PAD | CUTANEOUS | Status: DC | PRN

## 2018-11-02 MED ORDER — DIBUCAINE 1 % RE OINT: 1.0000 "application " | TOPICAL_OINTMENT | RECTAL | Status: DC | PRN

## 2018-11-02 MED ORDER — ONDANSETRON HCL 4 MG/2ML IJ SOLN: 4.0000 mg | Freq: Four times a day (QID) | INTRAMUSCULAR | Status: DC | PRN

## 2018-11-02 MED ORDER — MISOPROSTOL 200 MCG PO TABS
800.0000 ug | ORAL_TABLET | Freq: Once | ORAL | Status: AC
  Administered 2018-11-02: 800 ug via ORAL
  Filled 2018-11-02 (×2): qty 4

## 2018-11-02 MED ORDER — SIMETHICONE 80 MG PO CHEW: 80.0000 mg | CHEWABLE_TABLET | ORAL | Status: DC | PRN

## 2018-11-02 MED ORDER — ACETAMINOPHEN 325 MG PO TABS: 650.0000 mg | ORAL_TABLET | ORAL | Status: DC | PRN

## 2018-11-02 MED ORDER — LIDOCAINE HCL (PF) 1 % IJ SOLN
30.0000 mL | INTRAMUSCULAR | Status: AC | PRN
  Administered 2018-11-02: 30 mL via SUBCUTANEOUS
  Filled 2018-11-02: qty 30

## 2018-11-02 MED ORDER — COCONUT OIL OIL: 1.0000 "application " | TOPICAL_OIL | Status: DC | PRN

## 2018-11-02 MED ORDER — MAGNESIUM HYDROXIDE 400 MG/5ML PO SUSP: 30.0000 mL | ORAL | Status: DC | PRN

## 2018-11-02 MED ORDER — VITAMIN K1 1 MG/0.5ML IJ SOLN
INTRAMUSCULAR | Status: AC
  Filled 2018-11-02: qty 0.5

## 2018-11-02 MED ORDER — TRANEXAMIC ACID-NACL 1000-0.7 MG/100ML-% IV SOLN
1000.0000 mg | Freq: Once | INTRAVENOUS | Status: DC
  Filled 2018-11-02: qty 100

## 2018-11-02 MED ORDER — ONDANSETRON HCL 4 MG/2ML IJ SOLN: 4.0000 mg | INTRAMUSCULAR | Status: DC | PRN

## 2018-11-02 MED ORDER — FERROUS SULFATE 325 (65 FE) MG PO TABS
325.0000 mg | ORAL_TABLET | Freq: Two times a day (BID) | ORAL | Status: DC
  Administered 2018-11-02: 325 mg via ORAL
  Filled 2018-11-02: qty 1

## 2018-11-02 MED ORDER — MISOPROSTOL 200 MCG PO TABS
800.0000 ug | ORAL_TABLET | Freq: Once | ORAL | Status: AC
  Administered 2018-11-02: 800 ug via ORAL
  Filled 2018-11-02: qty 4

## 2018-11-02 MED ORDER — EPHEDRINE 5 MG/ML INJ
10.0000 mg | INTRAVENOUS | Status: DC | PRN
  Filled 2018-11-02: qty 2

## 2018-11-02 MED ORDER — TETANUS-DIPHTH-ACELL PERTUSSIS 5-2.5-18.5 LF-MCG/0.5 IM SUSP: 0.5000 mL | Freq: Once | INTRAMUSCULAR | Status: DC

## 2018-11-02 MED ORDER — CARBOPROST TROMETHAMINE 250 MCG/ML IM SOLN
250.0000 ug | Freq: Once | INTRAMUSCULAR | Status: AC
  Administered 2018-11-02: 250 ug via INTRAMUSCULAR
  Filled 2018-11-02: qty 1

## 2018-11-02 MED ORDER — OXYTOCIN 40 UNITS IN NORMAL SALINE INFUSION - SIMPLE MED
2.5000 [IU]/h | INTRAVENOUS | Status: DC
  Administered 2018-11-02: 2.5 [IU]/h via INTRAVENOUS

## 2018-11-02 MED ORDER — OXYTOCIN BOLUS FROM INFUSION: 500.0000 mL | Freq: Once | INTRAVENOUS | Status: DC

## 2018-11-02 MED ORDER — PRENATAL MULTIVITAMIN CH
1.0000 | ORAL_TABLET | Freq: Every day | ORAL | Status: DC
  Administered 2018-11-02 – 2018-11-04 (×2): 1 via ORAL
  Filled 2018-11-02 (×2): qty 1

## 2018-11-02 MED ORDER — PHENYLEPHRINE 40 MCG/ML (10ML) SYRINGE FOR IV PUSH (FOR BLOOD PRESSURE SUPPORT)
80.0000 ug | PREFILLED_SYRINGE | INTRAVENOUS | Status: DC | PRN
  Filled 2018-11-02: qty 10

## 2018-11-02 MED ORDER — LACTATED RINGERS IV SOLN: 500.0000 mL | Freq: Once | INTRAVENOUS | Status: DC

## 2018-11-02 MED ORDER — BENZOCAINE-MENTHOL 20-0.5 % EX AERO
1.0000 "application " | INHALATION_SPRAY | CUTANEOUS | Status: DC | PRN
  Filled 2018-11-02: qty 56

## 2018-11-02 NOTE — Anesthesia Pain Management Evaluation Note (Signed)
  CRNA Pain Management Visit Note  Patient: Stacey Wyatt, 23 y.o., female  "Hello I am a member of the anesthesia team at [facility]. We have an anesthesia team available at all times to provide care throughout the hospital, including epidural management and anesthesia for C-section. I don't know your plan for the delivery whether it a natural birth, water birth, IV sedation, nitrous supplementation, doula or epidural, but we want to meet your pain goals."   1.Was your pain managed to your expectations on prior hospitalizations?   No prior hospitalizations  2.What is your expectation for pain management during this hospitalization?     Epidural and IV pain meds  3.How can we help you reach that goal? Possible epidural  Record the patient's initial score and the patient's pain goal.   Pain: 10  Pain Goal: 10 [facility] wants you to be able to say your pain was always managed very well.  Alexsandra Shontz 11/02/2018

## 2018-11-02 NOTE — Lactation Note (Signed)
This note was copied from a baby's chart. Lactation Consultation Note:  P1, Infant is 5 hours old.  Staff nurse paged and ask for assistance to latch infant.  Mother concerned that infant has not fed since birth. Basic breastfeeding teaching done.  Mother was able to hand express colostrum without difficulty.  Assist mother in firming her nipple.  Mother taught to latch with an off sided latch technique.  Infant latched on with strong tugging on and off for 10 mins.  Mother taught to do football hold with good pillow support . Mother also taught to use firm support to support infants head and neck.  Infant placed in cross cradle hold. Mother hand expressed colostrum. Infant latched on and of again for 10 mins.  Mother very receptive to all teaching.  She is alone at present but reports that FOB coming soon.  Discussed cue chart and reviewed cluster feeding.  Advised mother to breastfeed 8-12 times or more in 24 hours.  Mother to ask for assistance if unable to latch infant.   Mother was given [facility] brochure and informed of all available LC services and community support.    Patient Name: Stacey Wyatt [id number] Date: 11/02/2018 Reason for consult: Initial assessment   Maternal Data Has patient been taught Hand Expression?: Yes Does the patient have breastfeeding experience prior to this delivery?: No  Feeding Feeding Type: Breast Fed  LATCH Score Latch: Repeated attempts needed to sustain latch, nipple held in mouth throughout feeding, stimulation needed to elicit sucking reflex.  Audible Swallowing: A few with stimulation  Type of Nipple: Everted at rest and after stimulation  Comfort (Breast/Nipple): Soft / non-tender  Hold (Positioning): Assistance needed to correctly position infant at breast and maintain latch.  LATCH Score: 7  Interventions Interventions: Breast feeding basics reviewed;Assisted with latch;Skin to skin;Hand express;Adjust position;Support  pillows;Position options;Expressed milk;Hand pump  Lactation Tools Discussed/Used Initiated by:: staff nurse Date initiated:: 11/02/18   Consult Status Consult Status: Follow-up Date: 11/03/18 Follow-up type: In-patient    Stevan Born [facility] 11/02/2018, 3:31 PM

## 2018-11-02 NOTE — Anesthesia Preprocedure Evaluation (Signed)

## 2018-11-02 NOTE — Progress Notes (Signed)
Post Partum Day 0 Subjective: no complaints, up ad lib, voiding, tolerating PO and + flatus  Objective: Blood pressure 132/77, pulse (!) 104, temperature 98 F (36.7 C), temperature source Oral, resp. rate 18, height 5\' 9"  (1.753 m), weight 94.8 kg, last menstrual period 01/27/2018, SpO2 100 %, unknown if currently breastfeeding.  Physical Exam:  General: alert, cooperative, appears stated age and no distress Lochia: appropriate Uterine Fundus: firm deviated to left DVT Evaluation: No evidence of DVT seen on physical exam.  Recent Labs    11/02/18 0337  HGB 11.7*  HCT 34.7*   Patient Vitals for the past 24 hrs:  BP Temp Temp src Pulse Resp SpO2 Height Weight  11/02/18 1556 132/77 - - (!) 104 18 100 % - -  11/02/18 1357 115/89 98 F (36.7 C) Oral 92 18 - - -   Assessment/Plan: --Called to room for assessment after patient passed large clot around 4:30-4:45pm --Fundus firm, deviated to left --Patient asymptomatic --RN asked to assist patient to bathroom to void, reassess bleeding in one hour (about 5:15pm) --Notify provider for new onset SOB, activity intolerance, signs of increasing acuity   LOS: 0 days   Calvert Cantor, CNM 11/02/2018, 4:21 PM

## 2018-11-02 NOTE — Progress Notes (Signed)
Dr. Earlene Plater was notified that patient had passed another large clot weighing 270cc. He will be over shortly to check her. Will monitor.

## 2018-11-02 NOTE — Progress Notes (Signed)
Provider notified. RN suggested calling hemorrhage. Provider stated not needed at this time and Dr. Despina Hidden would be called to beside.

## 2018-11-02 NOTE — Progress Notes (Addendum)
Dr. Dareen Piano notified. Provider stated that she would follow up. No orders at this time. Patient refused tylenol at this time.

## 2018-11-02 NOTE — Progress Notes (Signed)
During hand off report, patient stated she felt like she was "gushing" and that she felt like she was peeing on herself. Off going RN and this RN performed immediate fundal check. Upon massage, large clot expelled. RN called for assistance and Triton scale to measure clot. Fundus firmed immediately after expelling clot. EBL 448 measured with triton scale. Provider notified to come evaluate patient. After provider evaluation, RN assisted patient to the restroom to void due to deviated fundus. Patient became dizzy while in the bathroom but stated she was felt ok when sitting down. Patient passed smaller clot in the toilet but was able to void. RN assisted patient back to bed and patient stated she no longer felt dizzy. Instructed patient to call immediately if she felt another large gush or if she felt as though she was peeing on herself or if she became short of breath, dizzy or felt like she was going to faint. Patient voiced understanding. Will reassess bleeding and fundal tone in one hour.

## 2018-11-02 NOTE — MAU Note (Signed)
PT SAYS SROM AT 0130- CLEAR FLUID  FEELS  BABY MOVING     WAS 3 CM  LAST  VE.   DENIES HSV AND MRSA.   GBS-  NEG

## 2018-11-02 NOTE — Progress Notes (Signed)
Patient ID: Stacey Wyatt, female   DOB: March 16, 1996, 23 y.o.   MRN: [id number] Stacey Wyatt is a 23 y.o. G2P0010 at [redacted]w[redacted]d admitted for active labor, rupture of membranes  Subjective: Uncomfortable w/ uc's, doesn't want epidural  Objective: BP 128/84   Pulse 93   Temp 97.8 F (36.6 C) (Oral)   Resp 18   Ht 5\' 9"  (1.753 m)   Wt 94.8 kg   LMP 01/27/2018   BMI 30.86 kg/m  No intake/output data recorded.  FHT:  FHR: 125 bpm, variability: moderate,  accelerations:  Present,  decelerations:  Absent UC:   q 2-11mins, not tracing well  SVE:   Dilation: 5 Effacement (%): 90 Station: -2 Exam by:: Joellyn Haff, CNM  Labs: Lab Results  Component Value Date   WBC 8.7 11/02/2018   HGB 11.7 (L) 11/02/2018   HCT 34.7 (L) 11/02/2018   MCV 88.1 11/02/2018   PLT 274 11/02/2018    Assessment / Plan: Spontaneous labor, progressing normally  Labor: Progressing normally Fetal Wellbeing:  Category I Pain Control:  declines right now, doing well w/ breathing Pre-eclampsia: n/a I/D:  n/a Anticipated MOD:  NSVD  Cheral Marker CNM, WHNP-BC 11/02/2018, 6:06 AM

## 2018-11-02 NOTE — Anesthesia Procedure Notes (Signed)
Epidural Patient location during procedure: OB Start time: 11/02/2018 8:32 AM End time: 11/02/2018 8:37 AM  Staffing Anesthesiologist: Bethena Midget, MD  Preanesthetic Checklist Completed: patient identified, site marked, surgical consent, pre-op evaluation, timeout performed, IV checked, risks and benefits discussed and monitors and equipment checked  Epidural Patient position: sitting Prep: site prepped and draped and DuraPrep Patient monitoring: continuous pulse ox and blood pressure Approach: midline Location: L3-L4 Injection technique: LOR air  Needle:  Needle type: Tuohy  Needle gauge: 17 G Needle length: 9 cm and 9 Needle insertion depth: 5 cm cm Catheter type: closed end flexible Catheter size: 19 Gauge Catheter at skin depth: 10 cm Test dose: negative  Assessment Events: blood not aspirated, injection not painful, no injection resistance, negative IV test and no paresthesia

## 2018-11-02 NOTE — H&P (Addendum)
Stacey Wyatt is a 23 y.o. G2P0010 female at [redacted]w[redacted]d by LMP presenting w/ SROM clear fluid at 0130.   Reports active fetal movement, contractions: irregular, vaginal bleeding: none, membranes: ruptured, clear fluid. Initiated prenatal care at [facility] at 7 wks.    This pregnancy complicated by: none  Prenatal History/Complications:  H/o AB  Past Medical History: Past Medical History:  Diagnosis Date  . Allergy   . Asthma   . Seasonal allergies   . Syncope     Past Surgical History: Past Surgical History:  Procedure Laterality Date  . NASAL POLYP SURGERY Bilateral     Obstetrical History: OB History    Gravida  2   Para      Term      Preterm      AB  1   Living        SAB  1   TAB      Ectopic      Multiple      Live Births              Social History: Social History   Socioeconomic History  . Marital status: Single    Spouse name: Not on file  . Number of children: Not on file  . Years of education: Not on file  . Highest education level: Not on file  Occupational History  . Not on file  Social Needs  . Financial resource strain: Not on file  . Food insecurity:    Worry: Not on file    Inability: Not on file  . Transportation needs:    Medical: Not on file    Non-medical: Not on file  Tobacco Use  . Smoking status: Never Smoker  . Smokeless tobacco: Never Used  Substance and Sexual Activity  . Alcohol use: No    Alcohol/week: 0.0 standard drinks  . Drug use: No  . Sexual activity: Not Currently    Birth control/protection: None  Lifestyle  . Physical activity:    Days per week: Not on file    Minutes per session: Not on file  . Stress: Not on file  Relationships  . Social connections:    Talks on phone: Not on file    Gets together: Not on file    Attends religious service: Not on file    Active member of club or organization: Not on file    Attends meetings of clubs or organizations: Not on file    Relationship status: Not  on file  Other Topics Concern  . Not on file  Social History Narrative  . Not on file    Family History: Family History  Problem Relation Age of Onset  . Hypertension Mother   . Allergic rhinitis Father     Allergies: No Known Allergies  Medications Prior to Admission  Medication Sig Dispense Refill Last Dose  . albuterol (PROVENTIL HFA;VENTOLIN HFA) 108 (90 Base) MCG/ACT inhaler Inhale 2 puffs into the lungs every 6 (six) hours as needed for wheezing or shortness of breath. 18 g 5 Past Month at Unknown time  . calcium carbonate (TUMS - DOSED IN MG ELEMENTAL CALCIUM) 500 MG chewable tablet Chew 1 tablet by mouth daily as needed for indigestion or heartburn.   11/01/2018 at Unknown time  . fluticasone (FLONASE) 50 MCG/ACT nasal spray Place 2 sprays into both nostrils daily. 48 g 0 11/01/2018 at Unknown time  . loratadine (CLARITIN) 10 MG tablet Take 1 tablet (10 mg total) by mouth daily.  90 tablet 3 11/01/2018 at Unknown time  . Prenat-FeAsp-Meth-FA-DHA w/o A (PRENATE PIXIE) 10-0.6-0.4-200 MG CAPS Take 1 capsule by mouth daily. 30 capsule 12 11/01/2018 at Unknown time  . beclomethasone (QVAR REDIHALER) 40 MCG/ACT inhaler 2 puffs as needed for wheezing. 1 Inhaler 0 More than a month at Unknown time  . Butalbital-APAP-Caffeine 50-325-40 MG capsule Take 1-2 capsules by mouth every 6 (six) hours as needed for headache. 30 capsule 3 More than a month at Unknown time  . famotidine (PEPCID) 20 MG tablet Take 1 tablet (20 mg total) by mouth 2 (two) times daily. 60 tablet 3 More than a month at Unknown time    Review of Systems  Pertinent pos/neg as indicated in HPI  Blood pressure 119/75, pulse 97, temperature 98.1 F (36.7 C), temperature source Oral, resp. rate 20, height 5\' 9"  (1.753 m), weight 94.8 kg, last menstrual period 01/27/2018, unknown if currently breastfeeding. General appearance: alert, cooperative and no distress Lungs: clear to auscultation bilaterally Heart: regular rate and  rhythm Abdomen: gravid, soft, non-tender Extremities: tr edema DTR's 2+  Fetal monitoring: FHR: 135 bpm, variability: moderate,  Accelerations: Present,  decelerations:  Absent Uterine activity: q 2-24mins Dilation: 4 Effacement (%): 70 Station: -1 Exam by:: DCALLAWAY, RN  Presentation: cephalic   Prenatal labs: ABO, Rh: B/Positive/-- (10/31 0955) Antibody: CANCELED (06/12 0000) Rubella: 12.90 (06/12 0000) RPR: Non Reactive (10/31 0958)  HBsAg: Negative (06/12 0000)  HIV: Non Reactive (10/31 0958)  GBS:     2hr GTT: 75/107/105 Genetic screening:  Normal NIPS Anatomy US: normal  No results found for this or any previous visit (from the past 24 hour(s)).   Assessment:  [redacted]w[redacted]d SIUP  G2P0010  SROM/early labor  Cat 1 FHR  GBS  neg  Plan:  Admit to BS  IV pain meds/epidural prn active labor  Expectant management  Anticipate NSVB   Plans to breastfeed  Contraception: IUD  Circumcision: n/a  Cheral Marker CNM, WHNP-BC 11/02/2018, 3:28 AM

## 2018-11-02 NOTE — Discharge Summary (Addendum)
Postpartum Discharge Summary     Patient Name: Stacey Wyatt DOB: 04/06/1996 MRN: [id number]  Date of admission: 11/02/2018 Delivering Provider: Calvert CantorWEINHOLD, SAMANTHA C   Date of discharge: 11/04/2018  Admitting diagnosis: 39.6WKS WATER BROKE Intrauterine pregnancy: 3757w6d     Secondary diagnosis:  Active Problems:   Supervision of other normal pregnancy, antepartum   Amniotic fluid leaking   Postpartum hemorrhage   Additional problems: None     Discharge diagnosis: Term Pregnancy Delivered, Anemia and PPH                                                                                                Post partum procedures:blood transfusion x 2u PRBCs  Augmentation: No augmentation required  Complications: Hemorrhage>101900mL (see details below)  Hospital course:  Onset of Labor With Vaginal Delivery     23 y.o. yo G2P1011 at 8457w6d was admitted in Latent Labor on 11/02/2018. Patient had an uncomplicated spontaneous labor course as follows:  Membrane Rupture Time/Date: 1:30 AM ,11/02/2018   Intrapartum Procedures: Episiotomy: None [1]                                         Lacerations:  2nd degree [3];Perineal [11]  Patient had a delivery of a Viable infant. 11/02/2018  Information for the patient's newborn:  Lavenia AtlasGaskew, Girl Catha [[id number]][[id number]]  Delivery Method: Vag-Spont    Patient continued to have trickling bleed with 2L total postpartum hemorrhage on PPD#0 11/02/2018. Dr. Despina HiddenEure, the attending on call, was made aware, saw the patient and encouraged conservative management for Atonic Uterus. Vitals were checked q2 hours.   Hemabate was given at 11/02/2018 at 2246, 800 mcg Cytotec at 1944 and 2241 11/02/2018, she was bolused 500cc LR at 0042 on 11/03/2018 and started on maintenance LR at 0209, TXA 1000mg  at 2255 11/02/2018, and methergine at 1907, 2246 and 0307 01/29-01/30/2020.  While the above medications were being administered the patient was found to be febrile to 101*F at  0235 on 11/03/2018 but declined tylenol. Found to be febrile to 100.4*F again at 0307. She declined tylenol again but took Imodium for her diarrhea. The fevers and diarrhea were most likely due to adverse reactions from hemabate and/or cytotec. The patient's vitals remained stable (HR in upper 90s, Bps in 130/90s) during this time.   CBC was checked 11/03/2018 and it was found to decrease from 11.7 > 6.3. She was transfused 2 units PRBCs and her hemoglobin on recheck at 0500 on 11/04/2018 was only 7.5. The patient been successfully ambulating in her room but had not been ambulating the hallway. A repeat CBC was drawn at 1200 on 01/31/202 and was found to be 8.2.    She is ambulating, tolerating a regular diet, passing flatus, and urinating well. Patient is discharged home in stable condition on 11/04/18.   Magnesium Sulfate recieved: No BMZ received: No  Physical exam  Vitals:   11/03/18 2015 11/03/18 2300 11/04/18 0553 11/04/18 1406  BP: 126/79 118/68 99/64 108/81  Pulse: Marland Kitchen(!)  111 99 86 (!) 103  Resp: 18 20 16 16   Temp: 98.6 F (37 C) 99.4 F (37.4 C) 98.1 F (36.7 C)   TempSrc: Axillary Oral Oral   SpO2: 100% 100%  99%  Weight:      Height:       General: alert, cooperative and no distress Lochia: appropriate Uterine Fundus: firm Incision: N/A DVT Evaluation: No evidence of DVT seen on physical exam. No significant calf/ankle edema. Labs: Lab Results  Component Value Date   WBC 9.6 11/04/2018   HGB 8.2 (L) 11/04/2018   HCT 23.9 (L) 11/04/2018   MCV 86.0 11/04/2018   PLT 175 11/04/2018   CMP Latest Ref Rng & Units 04/27/2017  Glucose 65 - 99 mg/dL 84  BUN 6 - 20 mg/dL 10  Creatinine 1.60 - 1.09 mg/dL 3.23  Sodium 557 - 322 mmol/L 138  Potassium 3.5 - 5.1 mmol/L 3.5  Chloride 101 - 111 mmol/L 102  CO2 22 - 32 mmol/L 27  Calcium 8.9 - 10.3 mg/dL 9.2  Total Protein 6.0 - 8.3 g/dL -  Total Bilirubin 0.2 - 1.2 mg/dL -  Alkaline Phos 47 - 025 U/L -  AST 0 - 37 U/L -  ALT  0 - 35 U/L -    Discharge instruction: per After Visit Summary and "Baby and Me Booklet".  After visit meds:  Allergies as of 11/04/2018   No Known Allergies     Medication List    STOP taking these medications   Butalbital-APAP-Caffeine 50-325-40 MG capsule   calcium carbonate 500 MG chewable tablet Commonly known as:  TUMS - dosed in mg elemental calcium   famotidine 20 MG tablet Commonly known as:  PEPCID   fluticasone 50 MCG/ACT nasal spray Commonly known as:  FLONASE   loratadine 10 MG tablet Commonly known as:  CLARITIN     TAKE these medications   acetaminophen 325 MG tablet Commonly known as:  TYLENOL Take 2 tablets (650 mg total) by mouth every 6 (six) hours as needed (mild discomfortORtemperature>/= 100.4 F).   albuterol 108 (90 Base) MCG/ACT inhaler Commonly known as:  PROVENTIL HFA;VENTOLIN HFA Inhale 2 puffs into the lungs every 6 (six) hours as needed for wheezing or shortness of breath.   beclomethasone 40 MCG/ACT inhaler Commonly known as:  QVAR REDIHALER 2 puffs as needed for wheezing. What changed:    how much to take  how to take this  when to take this  reasons to take this  additional instructions   ferrous sulfate 325 (65 FE) MG tablet Take 1 tablet (325 mg total) by mouth daily with breakfast.   ibuprofen 600 MG tablet Commonly known as:  ADVIL,MOTRIN Take 1 tablet (600 mg total) by mouth every 6 (six) hours as needed for moderate pain or cramping.   PRENATE PIXIE 10-0.6-0.4-200 MG Caps Take 1 capsule by mouth daily.       Diet: routine diet  Activity: Advance as tolerated. Pelvic rest for 6 weeks.   Outpatient follow up:4 weeks Follow up Appt: Future Appointments  Date Time Provider Department Center  12/06/2018  8:45 AM Aviva Signs, CNM [facility] None   Follow up Visit: Follow-up Information    [facility] Follow up on 12/06/2018.   Specialty:  Obstetrics and  Gynecology Why:  for postpartum appointment Contact information: [address] [place] [address] [phone]           Please  schedule this patient for Postpartum visit in: 4 weeks with the following provider: Any provider For C/S patients schedule nurse incision check in weeks 2 weeks: no Low risk pregnancy complicated by: N/A Delivery mode:  SVD Anticipated Birth Control:  IUD PP Procedures needed: N/A  Schedule Integrated BH visit: no  Newborn Data: Live born female  Birth Weight: 7 lb 6.3 oz (3355 g) APGAR: 9, 9  Newborn Delivery   Birth date/time:  11/02/2018 09:27:00 Delivery type:  Vaginal, Spontaneous     Baby Feeding: Breast Disposition:home with mother   Rollene Rotunda, DO Family Medicine, PGY-1 11/04/2018 4:02 PM  CNM attestation I have seen and examined this patient and agree with above documentation in the resident's note.   Annemarie Corriveau is a 23 y.o. G2P1011 s/p SVD with a significant delayed PPH of >2L and blood transfusion.   Pain is well controlled.  Plan for birth control is IUD.  Method of Feeding: breast  PE:  BP 108/81 (BP Location: Left Arm)   Pulse (!) 103   Temp 98.1 F (36.7 C) (Oral)   Resp 16   Ht 5\' 9"  (1.753 m)   Wt 94.8 kg   LMP 01/27/2018   SpO2 99%   Breastfeeding Unknown   BMI 30.86 kg/m  Fundus firm  Recent Labs    11/04/18 0517 11/04/18 1238  HGB 7.6* 8.2*  HCT 22.9* 23.9*     Plan: discharge today - postpartum care discussed - f/u clinic in 4 weeks for postpartum visit   Arabella Merles, CNM 4:02 PM  11/04/2018

## 2018-11-02 NOTE — Anesthesia Postprocedure Evaluation (Signed)
Anesthesia Post Note  Patient: Anayelli Fosdick  Procedure(s) Performed: AN AD HOC LABOR EPIDURAL     Patient location during evaluation: Mother Baby Anesthesia Type: Epidural Level of consciousness: awake and alert Pain management: pain level controlled Vital Signs Assessment: post-procedure vital signs reviewed and stable Respiratory status: spontaneous breathing Cardiovascular status: blood pressure returned to baseline Postop Assessment: no backache, no headache, epidural receding, no apparent nausea or vomiting, patient able to bend at knees, able to ambulate and adequate PO intake Anesthetic complications: no    Last Vitals:  Vitals:   11/02/18 1115 11/02/18 1215  BP: 125/79 127/75  Pulse: 89 97  Resp: 18 18  Temp: 36.8 C 36.4 C  SpO2:      Last Pain:  Vitals:   11/02/18 1215  TempSrc: Oral  PainSc: 0-No pain   Pain Goal:                   Oscar Hank

## 2018-11-02 NOTE — Progress Notes (Signed)
RN and CRNA verified with lab type and screen and blood on stand by if needed

## 2018-11-02 NOTE — Progress Notes (Signed)
Patient called out to nurses desk stating she felt dizzy and felt another big gush of blood. This RN, Lianne CureBrandee Wiggins, RN and Alden BenjaminMacharia Hamilton, NT went to patients room with triton scale to evaluate patient. During fundal massage, large clot expelled that would not come completely out. Clayton BiblesSamantha Weinhold, CNM notified of this. V/S obtained while on phone with provider. Provider stated to cycle V/S every 15 minutes and that they were in the middle of a delivery and would be there as soon as they could. Charge nurse Raytheonatalie Branch notified. OB rapid response called by charge nurse. RN reported off to on coming nurse.

## 2018-11-02 NOTE — Progress Notes (Signed)
Patient called out to nurses desk saying she felt another large gush of blood. This RN and another RN went into room to check patient. During fundal massage, another large clot was expelled. EBL 270 measured with Triton Scale. Notified Press photographer, who came and assessed behind RN. Dr. Evonnie Dawes notified by charge nurse to come assess patient. Pitocin and methergine series ordered on patient. Medications explained to patient. Patient voiced understanding.

## 2018-11-03 LAB — CBC
HCT: 21.7 % — ABNORMAL LOW (ref 36.0–46.0)
HCT: 18.6 % — ABNORMAL LOW (ref 36.0–46.0)
Hemoglobin: 7.5 g/dL — ABNORMAL LOW (ref 12.0–15.0)
Hemoglobin: 6.3 g/dL — CL (ref 12.0–15.0)
MCH: 30.1 pg (ref 26.0–34.0)
MCH: 29.9 pg (ref 26.0–34.0)
MCHC: 34.6 g/dL (ref 30.0–36.0)
MCHC: 33.9 g/dL (ref 30.0–36.0)
MCV: 87.1 fL (ref 80.0–100.0)
MCV: 88.2 fL (ref 80.0–100.0)
NRBC: 0 % (ref 0.0–0.2)
Platelets: 249 10*3/uL (ref 150–400)
Platelets: 200 10*3/uL (ref 150–400)
RBC: 2.49 MIL/uL — ABNORMAL LOW (ref 3.87–5.11)
RBC: 2.11 MIL/uL — AB (ref 3.87–5.11)
RDW: 14.1 % (ref 11.5–15.5)
RDW: 14.3 % (ref 11.5–15.5)
WBC: 15.7 10*3/uL — ABNORMAL HIGH (ref 4.0–10.5)
WBC: 9.9 10*3/uL (ref 4.0–10.5)
nRBC: 0 % (ref 0.0–0.2)

## 2018-11-03 LAB — PREPARE RBC (CROSSMATCH)

## 2018-11-03 MED ORDER — LACTATED RINGERS IV BOLUS: 500.0000 mL | Freq: Once | INTRAVENOUS | Status: DC

## 2018-11-03 MED ORDER — SODIUM CHLORIDE 0.9% IV SOLUTION: Freq: Once | INTRAVENOUS | Status: DC

## 2018-11-03 MED ORDER — LOPERAMIDE HCL 2 MG PO CAPS
2.0000 mg | ORAL_CAPSULE | ORAL | Status: DC | PRN
  Administered 2018-11-03: 2 mg via ORAL
  Filled 2018-11-03 (×2): qty 1

## 2018-11-03 MED ORDER — OXYTOCIN 40 UNITS IN NORMAL SALINE INFUSION - SIMPLE MED
2.5000 [IU]/h | INTRAVENOUS | Status: DC
  Administered 2018-11-03: 2.5 [IU]/h via INTRAVENOUS

## 2018-11-03 MED ORDER — DIPHENHYDRAMINE HCL 50 MG/ML IJ SOLN
25.0000 mg | Freq: Once | INTRAMUSCULAR | Status: AC
  Administered 2018-11-03: 25 mg via INTRAVENOUS
  Filled 2018-11-03: qty 1

## 2018-11-03 MED ORDER — LACTATED RINGERS IV SOLN
INTRAVENOUS | Status: DC
  Administered 2018-11-03: 02:00:00 via INTRAVENOUS

## 2018-11-03 MED ORDER — LACTATED RINGERS IV BOLUS
500.0000 mL | Freq: Once | INTRAVENOUS | Status: AC
  Administered 2018-11-03: 500 mL via INTRAVENOUS

## 2018-11-03 MED ORDER — ACETAMINOPHEN 500 MG PO TABS: 1000.0000 mg | ORAL_TABLET | Freq: Once | ORAL | Status: DC

## 2018-11-03 MED ORDER — OXYTOCIN 40 UNITS IN NORMAL SALINE INFUSION - SIMPLE MED
10.0000 [IU]/h | INTRAVENOUS | Status: DC
  Filled 2018-11-03: qty 1000

## 2018-11-03 NOTE — Progress Notes (Signed)
Patient refusing tylenol at this time. Provider aware.

## 2018-11-03 NOTE — Progress Notes (Signed)
Pt temp 100.5. Resident Rollene Rotunda notified.  Order given to continue with transfusing of blood. Will continue monitor pt.

## 2018-11-03 NOTE — Progress Notes (Signed)
Post Partum Day 1  Subjective: The patient's vaginal bleeding has finally ceased. She was seen this morning resting in bed. She is able to ambulate but is very tired and fatigued. Denies shortness of breath and lightheadedness with ambulation. Her diarrhea is improving. She is stable at this time.  Objective: Blood pressure 100/63, pulse (!) 107, temperature 99.1 F (37.3 C), temperature source Oral, resp. rate 17, height 5\' 9"  (1.753 m), weight 94.8 kg, last menstrual period 01/27/2018, SpO2 100 %, unknown if currently breastfeeding.  Physical Exam:  General: alert, cooperative and no distress Lochia: appropriate, improved Uterine Fundus: firm Incision: None DVT Evaluation: No evidence of DVT seen on physical exam. No significant calf/ankle edema.  Recent Labs    11/02/18 1926 11/03/18 0544  HGB 9.4* 7.5*  HCT 28.3* 21.7*    Assessment/Plan: Plan for discharge tomorrow, Breastfeeding and Contraception IUD   LOS: 1 day   Dollene Cleveland 11/03/2018, 8:15 AM

## 2018-11-03 NOTE — Progress Notes (Signed)
CRITICAL VALUE ALERT  Critical Value: hgb 6.3  Date & Time Notied:  11-03-2018 1440  Provider Notified: Rollene Rotunda, Resident/        notified by Jamesetta Orleans, RN  Orders Received/Actions taken: None at this time

## 2018-11-03 NOTE — Progress Notes (Signed)
Refused tylenol

## 2018-11-03 NOTE — Progress Notes (Signed)
Faculty Note  Patient seen and examined bedside.   She is feeling okay while lying down, pain well controlled. Not having much bleeding, has not changed pad since last night. Tolerating clears without nausea/vomiting. Does have significant dizziness when she sat up at bedside. Also feeling very fatigued.   BP 116/67   Pulse (!) 101   Temp (!) 100.5 F (38.1 C) (Oral)   Resp 16   Ht 5\' 9"  (1.753 m)   Wt 94.8 kg   LMP 01/27/2018   SpO2 100%   Breastfeeding Unknown   BMI 30.86 kg/m  Gen: alert, oriented CVS: RRR Abd: soft, appropriately tender, fundus firm 2 fingers below umbilicus GU: minimal staining on pad Ext: no edema  UOP: adequate clear yellow urine  CBC Latest Ref Rng & Units 11/03/2018 11/03/2018 11/02/2018  WBC 4.0 - 10.5 K/uL 9.9 15.7(H) 12.3(H)  Hemoglobin 12.0 - 15.0 g/dL 6.3(LL) 7.5(L) 9.4(L)  Hematocrit 36.0 - 46.0 % 18.6(L) 21.7(L) 28.3(L)  Platelets 150 - 400 K/uL 200 249 245     A/P: 23 yo G2P1011 PPD#1 s/p SVD c/b PPH due to atony, improved with uterotonics. Still with good uterine tone now, will d/c pitocin. Patient significantly symptomatic from anemia with H/H down to 6.3/18.6 this afternoon. Reviewed the above with patient and her family and recommended blood transfusion of 2 units pRBCS. Reviewed risks including transfusion reaction, fever, low risk of transmission of disease. They verbalized understanding, answered all questions. Patient agreeable to blood transfusion.  2 units PRBC ordered Post op HH ordered Advance to regular diet Pain meds prn Routine postpartum care   Baldemar Lenis, M.D. Attending Center for [employer] [facility])

## 2018-11-03 NOTE — Progress Notes (Signed)
Faculty Attending Note  Post Partum Day 1  Subjective: Patient is feeling sleepy. She reports moderately well controlled pain on PO pain meds. She is not ambulating and reports some mild light-headedness with standing, feels fine when she is laying down. She is not passing flatus. She is tolerating a regular diet without nausea/vomiting. Bleeding is moderate.  Objective: Blood pressure 100/63, pulse (!) 107, temperature 99.1 F (37.3 C), temperature source Oral, resp. rate 17, height '5\' 9"'$  (1.753 m), weight 94.8 kg, last menstrual period 01/27/2018, SpO2 100 %, unknown if currently breastfeeding. Temp:  [97.6 F (36.4 C)-101 F (38.3 C)] 99.1 F (37.3 C) (01/30 0732) Pulse Rate:  [82-192] 107 (01/30 0732) Resp:  [16-22] 17 (01/30 0538) BP: (100-139)/(54-117) 100/63 (01/30 0732) SpO2:  [100 %] 100 % (01/30 0732)  Physical Exam:  General: alert, oriented, cooperative Chest: CTAB, normal respiratory effort Heart: RRR  Abdomen: soft, appropriately tender to palpation  Uterine Fundus: firm, 2 fingers below the umbilicus Lochia: moderate, rubra DVT Evaluation: no evidence of DVT Extremities: no edema, no calf tenderness   Current Facility-Administered Medications:  .  acetaminophen (TYLENOL) tablet 650 mg, 650 mg, Oral, Q4H PRN, Roma Schanz, CNM .  acetaminophen (TYLENOL) tablet 650 mg, 650 mg, Oral, Q4H PRN, Darlina Rumpf, CNM, 650 mg at 11/02/18 2301 .  benzocaine-Menthol (DERMOPLAST) 20-0.5 % topical spray 1 application, 1 application, Topical, PRN, Darlina Rumpf, CNM .  coconut oil, 1 application, Topical, PRN, Darlina Rumpf, CNM .  witch hazel-glycerin (TUCKS) pad 1 application, 1 application, Topical, PRN **AND** dibucaine (NUPERCAINAL) 1 % rectal ointment 1 application, 1 application, Rectal, PRN, Darlina Rumpf, CNM .  diphenhydrAMINE (BENADRYL) capsule 25 mg, 25 mg, Oral, Q6H PRN, Darlina Rumpf, CNM .  fentaNYL (SUBLIMAZE) injection  50-100 mcg, 50-100 mcg, Intravenous, Q1H PRN, Roma Schanz, CNM, 100 mcg at 11/02/18 9735 .  ferrous sulfate tablet 325 mg, 325 mg, Oral, QODAY, Daves, Harrison A, MD .  hydrOXYzine (ATARAX/VISTARIL) tablet 50 mg, 50 mg, Oral, Q6H PRN, Roma Schanz, CNM .  ibuprofen (ADVIL,MOTRIN) tablet 600 mg, 600 mg, Oral, Q6H, Weinhold, Samantha C, CNM, 600 mg at 11/02/18 1950 .  lactated ringers bolus 500 mL, 500 mL, Intravenous, Once, [employer] C, DO .  lactated ringers infusion 500-1,000 mL, 500-1,000 mL, Intravenous, PRN, Roma Schanz, CNM .  lactated ringers infusion, , Intravenous, Continuous, Roma Schanz, [place], Last Rate: 125 mL/hr at 11/02/18 0815 .  [COMPLETED] lactated ringers bolus 500 mL, 500 mL, Intravenous, Once, Last Rate: 250 mL/hr at 11/03/18 0042, 500 mL at 11/03/18 0042 **FOLLOWED BY** lactated ringers infusion, , Intravenous, Continuous, Daisy Floro, DO, Last Rate: 125 mL/hr at 11/03/18 0209 .  lidocaine (PF) (XYLOCAINE) 1 % injection 30 mL, 30 mL, Subcutaneous, PRN, Roma Schanz, CNM, 30 mL at 11/02/18 0935 .  loperamide (IMODIUM) capsule 2 mg, 2 mg, Oral, PRN, Nicolette Bang, DO, 2 mg at 11/03/18 0316 .  magnesium hydroxide (MILK OF MAGNESIA) suspension 30 mL, 30 mL, Oral, Q3 days PRN, Darlina Rumpf, CNM .  measles, mumps & rubella vaccine (MMR) injection 0.5 mL, 0.5 mL, Subcutaneous, Once, [place], [place] C, [place] .  methylergonovine (METHERGINE) tablet 0.2 mg, 0.2 mg, Oral, Q4H PRN, 0.2 mg at 11/03/18 0733 **OR** methylergonovine (METHERGINE) injection 0.2 mg, 0.2 mg, Intramuscular, Q4H PRN, Alvan Dame, MD, 0.2 mg at 11/02/18 2246 .  ondansetron (ZOFRAN) injection 4 mg, 4 mg, Intravenous, Q6H PRN, Roma Schanz,  CNM .  ondansetron (ZOFRAN) tablet 4 mg, 4 mg, Oral, Q4H PRN **OR** ondansetron (ZOFRAN) injection 4 mg, 4 mg, Intravenous, Q4H PRN, Darlina Rumpf, CNM .  oxyCODONE-acetaminophen (PERCOCET/ROXICET)  5-325 MG per tablet 1 tablet, 1 tablet, Oral, Q4H PRN, Roma Schanz, CNM .  oxyCODONE-acetaminophen (PERCOCET/ROXICET) 5-325 MG per tablet 1 tablet, 1 tablet, Oral, Q4H PRN, Darlina Rumpf, CNM .  oxyCODONE-acetaminophen (PERCOCET/ROXICET) 5-325 MG per tablet 2 tablet, 2 tablet, Oral, Q4H PRN, Roma Schanz, CNM .  oxyCODONE-acetaminophen (PERCOCET/ROXICET) 5-325 MG per tablet 2 tablet, 2 tablet, Oral, Q4H PRN, Darlina Rumpf, CNM .  oxytocin (PITOCIN) IV BOLUS FROM BAG, 500 mL, Intravenous, Once, [place], [employer], CNM .  oxytocin (PITOCIN) IV infusion 40 units in NS 1000 mL - Premix, 2.5 Units/hr, Intravenous, Continuous, Roma Schanz, CNM, Stopped at 11/02/18 1057 .  oxytocin (PITOCIN) IV infusion 40 units in NS 1000 mL - Premix, 2.5 Units/hr, Intravenous, Continuous, Daves, Harrison A, MD, Last Rate: 62.5 mL/hr at 11/02/18 1826, 2.5 Units/hr at 11/02/18 1826 .  prenatal multivitamin tablet 1 tablet, 1 tablet, Oral, Q1200, Darlina Rumpf, CNM, 1 tablet at 11/02/18 1341 .  simethicone (MYLICON) chewable tablet 80 mg, 80 mg, Oral, PRN, Darlina Rumpf, CNM .  sodium citrate-citric acid (ORACIT) solution 30 mL, 30 mL, Oral, Q2H PRN, Roma Schanz, CNM .  sodium phosphate (FLEET) 7-19 GM/118ML enema 1 enema, 1 enema, Rectal, PRN, Roma Schanz, CNM .  Tdap (BOOSTRIX) injection 0.5 mL, 0.5 mL, Intramuscular, Once, [place], [place] C, [place] .  tranexamic acid (CYKLOKAPRON) IVPB 1,000 mg, 1,000 mg, Intravenous, Once, Florian Buff, MD Recent Labs    11/02/18 1926 11/03/18 0544  HGB 9.4* 7.5*  HCT 28.3* 21.7*    Assessment/Plan:  Patient is 23 y.o. G2P1011 PPD#1 s/p SVD at 65w6dc/[place] requiring multiple uterotonics with H/H down to 7.5/21.7 this am. Fundus appropriate below umbilicus this am. She is feeling tired and mildly light-headed, will give IVF bolus now and recheck H/H this afternoon. Pain otherwise okay.   LR 500 mL IVF  bolus now Repeat H/H 1400  Cont methergine series Continue routine post partum care Pain meds prn Regular diet undecided for birth control    K. MArvilla Meres M.D. Attending Center for [employer](Faculty Practice)  11/03/2018, 9:26 AM

## 2018-11-03 NOTE — Progress Notes (Addendum)
Provider at bedside. Anderson ordered Vitals Q2 and additional LR 500cc bolus. RN will continue to monitor for bleeding.

## 2018-11-03 NOTE — Progress Notes (Signed)
I was called to see patient for episodic clots and small amount of on going vaginal, it does not appear to be any acute change but an ongoing process according to the nurse bleeding postpartum Total QBL to this point from the delivery cumulatively is 1865 cc Hemoglobin at 1926 is 9.4 hematocrit 28.3(prior to delivery 11.7/34.7) so a pretty standard interval drop in hemoglobin P108 BP 120/80s stable Pitocin is still going in IV fluid  Uterus is hypotonic and I suspect there is a blood clot in the lower uterine segment based on the tone and the episodic clots with a trickle Bimanual is performed while massaging the uterus and a 200 cc clot is removed from the lower uterine segment with subsequent significant firming of the uterine tone, and no further trickle is noted Fundus now 3 cm below umbilicus  Repeat IM metehrgine, cytotec 800 microgram bucally, 250 micrograms hemabate and 1000 m of TXA x 2 is ordered(2nd dose to be given 2 hours after first dose)  All given approximately 2245  Interval reassessment 30 minutes later reveals no active bleeding and a firm uterus and no bleeding with expression  Impression Delayed slow developing postpartum hemorrhage, currently resolved with methergin, cytotec, hemabate and TXA  Will continue the IV oxytocin and the oral methergine series as well  Pt and her family aware if does not improve will require uterine exploration and probable placement of a Bakri balloon; however, for now, uterine tone is good and bleeding has essentially stopped  Lazaro Arms, MD

## 2018-11-03 NOTE — Progress Notes (Addendum)
Patient continuing to having trickling bleed with 2L+ postpartum hemorrhage at this time. Dr. Despina Hidden aware and encouraging conservative management at this time. Misty RN is checking vitals q2 hours.   Hemabate was given at 2246, 800 mcg Cytotec at 1944 and 2241, bolused 500cc LR at 0042, started on maintenance LR at 0209, TXA 1000mg  at 2255, and methergine at 1907, 2246 and 0307.  Patient found to be febrile to 101*F at 0235 but declined tylenol. Found to be febrile to 100.4*F again at 0307. She then declined tylenol for Fever but took Imodium for her diarrhea.  Blood pressure 125/75, pulse (!) 121, temperature (!) 100.4 F (38 C), temperature source Oral, resp. rate 20, height 5\' 9"  (1.753 m), weight 94.8 kg, SpO2 100 %  Peggyann Shoals, DO [facility], PGY-1 11/03/2018 4:45 AM

## 2018-11-03 NOTE — Progress Notes (Addendum)
Reported fever of 100.5 at 1600 and 100.8 and discovered at 1725. Patient was seen by myself at approximately 1730. Patient asymptomatic with no systemic signs such as hypotension, flank pain, difficulty breathing/shortness of breath, or extravasation at the IV site. Fever likely secondary to non-hemolytic transfusion reaction as opposed to TRALI or acute hemolytic transfusion reaction.

## 2018-11-04 LAB — HEMOGLOBIN AND HEMATOCRIT, BLOOD
HEMATOCRIT: 22.9 % — AB (ref 36.0–46.0)
Hemoglobin: 7.6 g/dL — ABNORMAL LOW (ref 12.0–15.0)

## 2018-11-04 LAB — TYPE AND SCREEN
ABO/RH(D): B POS
ANTIBODY SCREEN: NEGATIVE
Unit division: 0
Unit division: 0

## 2018-11-04 LAB — CBC
HCT: 23.9 % — ABNORMAL LOW (ref 36.0–46.0)
Hemoglobin: 8.2 g/dL — ABNORMAL LOW (ref 12.0–15.0)
MCH: 29.5 pg (ref 26.0–34.0)
MCHC: 34.3 g/dL (ref 30.0–36.0)
MCV: 86 fL (ref 80.0–100.0)
Platelets: 175 10*3/uL (ref 150–400)
RBC: 2.78 MIL/uL — ABNORMAL LOW (ref 3.87–5.11)
RDW: 16.1 % — ABNORMAL HIGH (ref 11.5–15.5)
WBC: 9.6 10*3/uL (ref 4.0–10.5)
nRBC: 0.2 % (ref 0.0–0.2)

## 2018-11-04 LAB — BPAM RBC
Blood Product Expiration Date: 202002102359
Blood Product Expiration Date: 202002112359
ISSUE DATE / TIME: 202001301604
ISSUE DATE / TIME: 202001301957
Unit Type and Rh: 5100
Unit Type and Rh: 5100

## 2018-11-04 MED ORDER — IBUPROFEN 600 MG PO TABS: 600.0000 mg | ORAL_TABLET | Freq: Four times a day (QID) | ORAL | 0 refills | Status: DC | PRN

## 2018-11-04 MED ORDER — IBUPROFEN 600 MG PO TABS: 600.0000 mg | ORAL_TABLET | Freq: Four times a day (QID) | ORAL | 0 refills | Status: DC

## 2018-11-04 MED ORDER — ACETAMINOPHEN 325 MG PO TABS: 650.0000 mg | ORAL_TABLET | Freq: Four times a day (QID) | ORAL | 0 refills | Status: DC | PRN

## 2018-11-04 MED ORDER — FERROUS SULFATE 325 (65 FE) MG PO TABS: 325.0000 mg | ORAL_TABLET | Freq: Every day | ORAL | 3 refills | Status: DC

## 2018-11-04 NOTE — Lactation Note (Addendum)
This note was copied from a baby's chart. Lactation Consultation Note  Patient Name: Stacey Wyatt [id number] Date: 11/04/2018 Reason for consult: Follow-up assessment   Baby 49 hours old.  Mother had PPH and states she has been mostly FO feeding since she has been tired.  Baby recently had 50 ml of formula.  Her breasts feel heavy and mother was willing to try breastfeeding again. Mother prepumped to help evert her nipples and hand expressed drops. Baby latched briefly and would come off. Suggest mother continue working on breastfeeding before offering formula. Reviewed engorgement care and monitoring voids/stools. Feed on demand approximately 8-12 times per day.   Mother states she has DEBP and plans to pump.  Recommend pumping q 3 hours. Mother aware of milk storage.  She states she took breastfeeding class at [facility].     Maternal Data    Feeding Feeding Type: Breast Fed  LATCH Score Latch: Repeated attempts needed to sustain latch, nipple held in mouth throughout feeding, stimulation needed to elicit sucking reflex.  Audible Swallowing: A few with stimulation  Type of Nipple: Everted at rest and after stimulation(short shaft)  Comfort (Breast/Nipple): Soft / non-tender  Hold (Positioning): Assistance needed to correctly position infant at breast and maintain latch.  LATCH Score: 7  Interventions Interventions: Breast feeding basics reviewed;Assisted with latch;Skin to skin;Hand express  Lactation Tools Discussed/Used     Consult Status Consult Status: Follow-up Date: 11/04/18    Dahlia Byes [facility] 11/04/2018, 11:02 AM

## 2018-11-04 NOTE — Discharge Instructions (Signed)

## 2018-11-08 ENCOUNTER — Encounter: Payer: Managed Care, Other (non HMO) | Admitting: Advanced Practice Midwife

## 2018-11-10 ENCOUNTER — Inpatient Hospital Stay (HOSPITAL_COMMUNITY)
Admission: RE | Admit: 2018-11-10 | Payer: Managed Care, Other (non HMO) | Source: Ambulatory Visit | Admitting: Obstetrics & Gynecology

## 2018-12-06 ENCOUNTER — Telehealth: Payer: Self-pay

## 2018-12-06 ENCOUNTER — Ambulatory Visit: Payer: Managed Care, Other (non HMO) | Admitting: Advanced Practice Midwife

## 2018-12-06 MED ORDER — FERROUS SULFATE 325 (65 FE) MG PO TABS: 325.0000 mg | ORAL_TABLET | Freq: Every day | ORAL | 3 refills | Status: DC

## 2018-12-06 NOTE — Telephone Encounter (Signed)
Patient and Pam, RN ( with family nurse partners) called stating the patient is cold all the time, has dark circles under her eyes, is lethargic, and states her baby feels heavy in her arms.   Made Pam RN aware that patient called and cancelled this morning's postpartum appointment because she didn't have a ride. Pam Rn states patient told her she over slept.   Pam RN states that patient never received iron pill perscription after delivery. I verified that ferrous sulfate, tylenol, and ibuprofen were all sent on 11-03-2018 to her pharmacy.   Will resend new script for iron.  Patient placed in first available on Thursday 12-08-18 at 245 with Dr. Adrian Blackwater. Patient accepts appointment. [name] RN

## 2018-12-08 ENCOUNTER — Encounter: Payer: Self-pay | Admitting: Family Medicine

## 2018-12-08 ENCOUNTER — Ambulatory Visit (INDEPENDENT_AMBULATORY_CARE_PROVIDER_SITE_OTHER): Payer: Managed Care, Other (non HMO) | Admitting: Family Medicine

## 2018-12-08 DIAGNOSIS — Z1389 Encounter for screening for other disorder: Secondary | ICD-10-CM | POA: Diagnosis not present

## 2018-12-08 DIAGNOSIS — R58 Hemorrhage, not elsewhere classified: Secondary | ICD-10-CM

## 2018-12-08 NOTE — Progress Notes (Signed)
Subjective:     Stacey Wyatt is a 23 y.o. female who presents for a postpartum visit. She is 5 weeks postpartum following a spontaneous vaginal delivery. I have fully reviewed the prenatal and intrapartum course. The delivery was at 39 gestational weeks. Outcome: spontaneous vaginal delivery. Anesthesia: epidural. Postpartum course has been normal. Baby's course has been normal. Baby is feeding by both breast and bottle - Stacey Wyatt . Bleeding staining only. Bowel function is normal. Bladder function is normal. Patient is not sexually active. Contraception method is none. Postpartum depression screening: negative.  The following portions of the patient's history were reviewed and updated as appropriate: allergies, current medications, past family history, past medical history, past social history, past surgical history and problem list.  Review of Systems Pertinent items are noted in HPI.   Objective:    LMP 01/27/2018   General:  alert, cooperative and no distress  Lungs: clear to auscultation bilaterally  Heart:  regular rate and rhythm, S1, S2 normal, no murmur, click, rub or gallop  Abdomen: soft, non-tender; bowel sounds normal; no masses,  no organomegaly   Vulva:  normal  Vagina: normal vagina, no discharge, exudate, lesion, or erythema        Assessment:     Normal postpartum exam. Pap smear not done at today's visit.   Plan:    1. Contraception: condoms 2. Follow up in: 1 year or as needed.

## 2018-12-09 LAB — CBC
HEMOGLOBIN: 11.8 g/dL (ref 11.1–15.9)
Hematocrit: 37.2 % (ref 34.0–46.6)
MCH: 28 pg (ref 26.6–33.0)
MCHC: 31.7 g/dL (ref 31.5–35.7)
MCV: 88 fL (ref 79–97)
Platelets: 303 10*3/uL (ref 150–450)
RBC: 4.21 x10E6/uL (ref 3.77–5.28)
RDW: 13.2 % (ref 11.7–15.4)
WBC: 5.4 10*3/uL (ref 3.4–10.8)

## 2018-12-12 ENCOUNTER — Ambulatory Visit: Payer: Managed Care, Other (non HMO) | Admitting: Family Medicine

## 2019-03-13 ENCOUNTER — Other Ambulatory Visit (HOSPITAL_COMMUNITY)
Admission: RE | Admit: 2019-03-13 | Discharge: 2019-03-13 | Disposition: A | Discharge status: Home / Self Care | Payer: Managed Care, Other (non HMO) | Source: Ambulatory Visit | Attending: Obstetrics & Gynecology | Admitting: Obstetrics & Gynecology

## 2019-03-13 ENCOUNTER — Ambulatory Visit: Payer: Managed Care, Other (non HMO)

## 2019-03-13 ENCOUNTER — Other Ambulatory Visit: Payer: Self-pay

## 2019-03-13 VITALS — BP 128/86 | HR 65 | Wt 184.1 lb

## 2019-03-13 DIAGNOSIS — N898 Other specified noninflammatory disorders of vagina: Secondary | ICD-10-CM | POA: Insufficient documentation

## 2019-03-13 DIAGNOSIS — N760 Acute vaginitis: Secondary | ICD-10-CM

## 2019-03-13 DIAGNOSIS — B9689 Other specified bacterial agents as the cause of diseases classified elsewhere: Secondary | ICD-10-CM

## 2019-03-13 NOTE — Progress Notes (Addendum)
Pt states that she has been having discharge and odor x 1 month. Pt also requests STI testing. Self swab was sent to the lab. chiquita l wilson, CMA   Attestation of Attending Supervision of RN: Evaluation and management procedures were performed by the nurse under my supervision and collaboration.  I have reviewed the nursing note and chart, and I agree with the management and plan.  Carolyn L. Harraway-Smith, M.D., Cherlynn June

## 2019-03-15 LAB — CERVICOVAGINAL ANCILLARY ONLY
Bacterial vaginitis: POSITIVE — AB
Candida vaginitis: NEGATIVE
Chlamydia: NEGATIVE
Neisseria Gonorrhea: NEGATIVE
Trichomonas: NEGATIVE

## 2019-03-17 ENCOUNTER — Telehealth: Payer: Self-pay

## 2019-03-17 ENCOUNTER — Other Ambulatory Visit: Payer: Self-pay | Admitting: Obstetrics & Gynecology

## 2019-03-17 MED ORDER — METRONIDAZOLE 500 MG PO TABS: 500.0000 mg | ORAL_TABLET | Freq: Two times a day (BID) | ORAL | 0 refills | Status: DC

## 2019-03-17 NOTE — Telephone Encounter (Signed)
Patient called and made aware that she has BV from her self swab. Patient made aware that flagyl has been called in for her to take. Patient made aware to avoid any alcohol while taking medication. Kathrene Alu RN

## 2019-03-17 NOTE — Addendum Note (Signed)
Addended by: Lavonia Drafts on: 03/17/2019 10:50 AM   Modules accepted: Orders

## 2019-06-12 ENCOUNTER — Other Ambulatory Visit: Payer: Self-pay

## 2019-06-12 DIAGNOSIS — B9689 Other specified bacterial agents as the cause of diseases classified elsewhere: Secondary | ICD-10-CM

## 2019-06-12 DIAGNOSIS — N898 Other specified noninflammatory disorders of vagina: Secondary | ICD-10-CM

## 2019-06-12 DIAGNOSIS — N760 Acute vaginitis: Secondary | ICD-10-CM

## 2019-06-13 ENCOUNTER — Other Ambulatory Visit: Payer: Self-pay

## 2019-06-13 ENCOUNTER — Telehealth: Payer: Medicaid Other | Admitting: Nurse Practitioner

## 2019-06-13 DIAGNOSIS — B9689 Other specified bacterial agents as the cause of diseases classified elsewhere: Secondary | ICD-10-CM

## 2019-06-13 DIAGNOSIS — N898 Other specified noninflammatory disorders of vagina: Secondary | ICD-10-CM

## 2019-06-13 DIAGNOSIS — N760 Acute vaginitis: Secondary | ICD-10-CM

## 2019-06-13 MED ORDER — METRONIDAZOLE 500 MG PO TABS: 500.0000 mg | ORAL_TABLET | Freq: Two times a day (BID) | ORAL | 0 refills | Status: DC

## 2019-06-13 NOTE — Progress Notes (Signed)

## 2019-06-14 MED ORDER — METRONIDAZOLE 500 MG PO TABS: 500.0000 mg | ORAL_TABLET | Freq: Two times a day (BID) | ORAL | 0 refills | Status: DC

## 2019-11-01 ENCOUNTER — Emergency Department (HOSPITAL_COMMUNITY)
Admission: EM | Admit: 2019-11-01 | Discharge: 2019-11-01 | Disposition: A | Discharge status: Home / Self Care | Payer: BC Managed Care – PPO | Attending: Emergency Medicine | Admitting: Emergency Medicine

## 2019-11-01 ENCOUNTER — Encounter (HOSPITAL_COMMUNITY): Payer: Self-pay

## 2019-11-01 ENCOUNTER — Other Ambulatory Visit: Payer: Self-pay

## 2019-11-01 DIAGNOSIS — J45909 Unspecified asthma, uncomplicated: Secondary | ICD-10-CM | POA: Insufficient documentation

## 2019-11-01 DIAGNOSIS — E860 Dehydration: Secondary | ICD-10-CM | POA: Diagnosis not present

## 2019-11-01 DIAGNOSIS — Z79899 Other long term (current) drug therapy: Secondary | ICD-10-CM | POA: Insufficient documentation

## 2019-11-01 DIAGNOSIS — R55 Syncope and collapse: Secondary | ICD-10-CM | POA: Diagnosis not present

## 2019-11-01 DIAGNOSIS — Z52098 Other blood donor, other blood: Secondary | ICD-10-CM | POA: Insufficient documentation

## 2019-11-01 LAB — URINALYSIS, ROUTINE W REFLEX MICROSCOPIC
Bilirubin Urine: NEGATIVE
Glucose, UA: NEGATIVE mg/dL
Hgb urine dipstick: NEGATIVE
Ketones, ur: NEGATIVE mg/dL
Leukocytes,Ua: NEGATIVE
Nitrite: NEGATIVE
Protein, ur: NEGATIVE mg/dL
Specific Gravity, Urine: 1.012 (ref 1.005–1.030)
pH: 6 (ref 5.0–8.0)

## 2019-11-01 LAB — CBC
HCT: 41.6 % (ref 36.0–46.0)
Hemoglobin: 13.8 g/dL (ref 12.0–15.0)
MCH: 30 pg (ref 26.0–34.0)
MCHC: 33.2 g/dL (ref 30.0–36.0)
MCV: 90.4 fL (ref 80.0–100.0)
Platelets: 289 10*3/uL (ref 150–400)
RBC: 4.6 MIL/uL (ref 3.87–5.11)
RDW: 12.8 % (ref 11.5–15.5)
WBC: 6.4 10*3/uL (ref 4.0–10.5)
nRBC: 0 % (ref 0.0–0.2)

## 2019-11-01 LAB — BASIC METABOLIC PANEL
Anion gap: 5 (ref 5–15)
BUN: 14 mg/dL (ref 6–20)
CO2: 24 mmol/L (ref 22–32)
Calcium: 8 mg/dL — ABNORMAL LOW (ref 8.9–10.3)
Chloride: 105 mmol/L (ref 98–111)
Creatinine, Ser: 0.88 mg/dL (ref 0.44–1.00)
GFR calc Af Amer: >60 mL/min (ref >60)
GFR calc non Af Amer: >60 mL/min (ref >60)
Glucose, Bld: 82 mg/dL (ref 70–99)
Potassium: 3.5 mmol/L (ref 3.5–5.1)
Sodium: 134 mmol/L — ABNORMAL LOW (ref 135–145)

## 2019-11-01 LAB — I-STAT BETA HCG BLOOD, ED (MC, WL, AP ONLY): I-stat hCG, quantitative: <5 m[IU]/mL (ref <5)

## 2019-11-01 LAB — CBG MONITORING, ED: Glucose-Capillary: 114 mg/dL — ABNORMAL HIGH (ref 70–99)

## 2019-11-01 MED ORDER — SODIUM CHLORIDE 0.9% FLUSH
3.0000 mL | Freq: Once | INTRAVENOUS | Status: AC
  Administered 2019-11-01: 17:00:00 3 mL via INTRAVENOUS

## 2019-11-01 MED ORDER — SODIUM CHLORIDE 0.9 % IV BOLUS
1000.0000 mL | Freq: Once | INTRAVENOUS | Status: AC
  Administered 2019-11-01: 1000 mL via INTRAVENOUS

## 2019-11-01 NOTE — ED Notes (Addendum)
Patient has not been seen in her room for some times now. RN was unable to complete the discharge process. Patient's IV was found by the tech in the trash can.

## 2019-11-01 NOTE — Discharge Instructions (Signed)
Please follow-up with your primary doctor regarding episode today.  Recommend discussing this episode with your primary doctor prior to donating any blood or plasma again.  Return to ER if you have additional episodes of passing out, develop any chest pain, abdominal pain or other new concerning symptom.

## 2019-11-01 NOTE — ED Triage Notes (Signed)
Per EMS- Patient donated plasma at 1100 today then went to the mall. While at the mall, she felt like passing out,  EMS gave 1000 cc NS prior to arrival to the ED.

## 2019-11-01 NOTE — ED Provider Notes (Signed)
[facility]-EMERGENCY DEPT Provider Note   CSN: [id number] Arrival date & time: 11/01/19  1615     History Chief Complaint  Patient presents with  . Near Syncope  . Hypotension    Stacey Wyatt is a 24 y.o. female.  Presents to ER after syncopal episode.  Patient states around 11:00 this morning she donated plasma for the first time.  Denies any symptoms at the time.  Went to the mall afterwards, while walking around she felt lightheaded and felt like she is going to pass out, slowly went to the ground.  Thinks she had complete loss consciousness briefly.  Denies any associated trauma.  Specifically denies hitting head.  She had no associated chest pain or abdominal pain or difficulty breathing.  States that she has had prior episodes of feeling like she is going to pass out.  Reports that her doctor said everything was okay previously.  She denies any known cardiac history or arrhythmia history.  HPI     Past Medical History:  Diagnosis Date  . Allergy   . Asthma   . Seasonal allergies   . Syncope     Patient Active Problem List   Diagnosis Date Noted  . Faintness 10/11/2015  . Seasonal allergies   . Allergy   . Syncope   . Asthma 03/11/2015  . Deviated nasal septum 12/25/2014  . Nasal polyposis 12/25/2014  . Chronic pansinusitis 12/25/2014  . 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 NASAL POLYP SURGERY Bilateral      OB History    Gravida  2   Para  1   Term  1   Preterm      AB  1   Living  1     SAB  1   TAB      Ectopic      Multiple  0   Live Births  1           Family History  Problem Relation Age of Onset  . Hypertension Mother   . Allergic rhinitis Father     Social History   Tobacco Use  . Smoking status: Never Smoker  . Smokeless tobacco: Never Used  Substance Use Topics  . Alcohol use: No    Alcohol/week: 0.0 standard drinks  . Drug  use: No    Home Medications Prior to Admission medications   Medication Sig Start Date End Date Taking? Authorizing Provider  CRANBERRY PO Take 3 capsules by mouth daily.   Yes [provider]  Probiotic Product (PROBIOTIC PO) Take 1 capsule by mouth daily.   Yes [provider]  acetaminophen (TYLENOL) 325 MG tablet Take 2 tablets (650 mg total) by mouth every 6 (six) hours as needed (mild discomfortORtemperature>/= 100.4 F). Patient not taking: Reported on 12/08/2018 11/04/18   Cristine Polio, MD  albuterol (PROVENTIL HFA;VENTOLIN HFA) 108 (90 Base) MCG/ACT inhaler Inhale 2 puffs into the lungs every 6 (six) hours as needed for wheezing or shortness of breath. Patient not taking: Reported on 12/08/2018 07/28/17   Saguier, Ramon Dredge, PA-C  beclomethasone (QVAR REDIHALER) 40 MCG/ACT inhaler 2 puffs as needed for wheezing. Patient not taking: Reported on 12/08/2018 08/11/18   Levie Heritage, DO  ferrous sulfate 325 (65 FE) MG tablet Take 1 tablet (325 mg total) by mouth daily with breakfast. Patient not taking: Reported on 03/13/2019 12/06/18   Levie Heritage, DO  ibuprofen (ADVIL,MOTRIN)  600 MG tablet Take 1 tablet (600 mg total) by mouth every 6 (six) hours as needed for moderate pain or cramping. Patient not taking: Reported on 12/08/2018 11/04/18   Cristine Polio, MD  metroNIDAZOLE (FLAGYL) 500 MG tablet Take 1 tablet (500 mg total) by mouth 2 (two) times daily. Patient not taking: Reported on 11/01/2019 06/14/19   Willodean Rosenthal, MD  metroNIDAZOLE (FLAGYL) 500 MG tablet Take 1 tablet (500 mg total) by mouth 2 (two) times daily. Patient not taking: Reported on 11/01/2019 06/13/19   Bennie Pierini, FNP  Prenat-FeAsp-Meth-FA-DHA w/o A (PRENATE PIXIE) 10-0.6-0.4-200 MG CAPS Take 1 capsule by mouth daily. Patient not taking: Reported on 11/01/2019 03/16/18   Reva Bores, MD    Allergies    Patient has no known allergies.  Review of Systems   Review of Systems    Constitutional: Negative for chills and fever.  HENT: Negative for ear pain and sore throat.   Eyes: Negative for pain and visual disturbance.  Respiratory: Negative for cough and shortness of breath.   Cardiovascular: Negative for chest pain and palpitations.  Gastrointestinal: Negative for abdominal pain and vomiting.  Genitourinary: Negative for dysuria and hematuria.  Musculoskeletal: Negative for arthralgias and back pain.  Skin: Negative for color change and rash.  Neurological: Positive for syncope. Negative for seizures.  All other systems reviewed and are negative.   Physical Exam Updated Vital Signs BP 116/75   Pulse 76   Temp (!) 97.3 F (36.3 C) (Oral)   Resp 12   Ht 5\' 9"  (1.753 m)   Wt 83.5 kg   LMP 10/31/2019   SpO2 100%   BMI 27.17 kg/m   Physical Exam Vitals and nursing note reviewed.  Constitutional:      General: She is not in acute distress.    Appearance: She is well-developed.  HENT:     Head: Normocephalic and atraumatic.  Eyes:     Conjunctiva/sclera: Conjunctivae normal.  Cardiovascular:     Rate and Rhythm: Normal rate and regular rhythm.     Heart sounds: No murmur.  Pulmonary:     Effort: Pulmonary effort is normal. No respiratory distress.     Breath sounds: Normal breath sounds.  Abdominal:     Palpations: Abdomen is soft.     Tenderness: There is no abdominal tenderness.  Musculoskeletal:        General: No swelling or tenderness.     Cervical back: Neck supple.  Skin:    General: Skin is warm and dry.     Capillary Refill: Capillary refill takes less than 2 seconds.  Neurological:     General: No focal deficit present.     Mental Status: She is alert and oriented to person, place, and time.  Psychiatric:        Mood and Affect: Mood normal.        Behavior: Behavior normal.     ED Results / Procedures / Treatments   Labs (all labs ordered are listed, but only abnormal results are displayed) Labs Reviewed  BASIC  METABOLIC PANEL - Abnormal; Notable for the following components:      Result Value   Sodium 134 (*)    Calcium 8.0 (*)    All other components within normal limits  CBG MONITORING, ED - Abnormal; Notable for the following components:   Glucose-Capillary 114 (*)    All other components within normal limits  URINALYSIS, ROUTINE W REFLEX MICROSCOPIC  CBC  I-STAT BETA HCG  BLOOD, ED ([facility], [facility], [facility] ONLY)    EKG EKG Interpretation  Date/Time:  Wednesday November 01 2019 16:25:14 EST Ventricular Rate:  79 PR Interval:    QRS Duration: 90 QT Interval:  400 QTC Calculation: 459 R Axis:   11 Text Interpretation: Sinus rhythm RSR' in V1 or V2, probably normal variant Confirmed by Madalyn Rob [phone]) on 11/01/2019 4:56:14 PM   Radiology No results found.  Procedures Procedures (including critical care time)  Medications Ordered in ED Medications  sodium chloride flush (NS) 0.9 % injection 3 mL (3 mLs Intravenous Given 11/01/19 1714)  sodium chloride 0.9 % bolus 1,000 mL (0 mLs Intravenous Stopped 11/01/19 1856)    ED Course  I have reviewed the triage vital signs and the nursing notes.  Pertinent labs & imaging results that were available during my care of the patient were reviewed by me and considered in my medical decision making (see chart for details).    MDM Rules/Calculators/A&P                      24 year old otherwise healthy lady presents to ER after syncopal episode.  Suspect related to dehydration, plasma donation earlier today.  Reports prior history of similar vasovagal episodes.  Work-up today including labs, EKG was unremarkable.  Recommend that she follow-up with her primary doctor.  Will discharge home.    After the discussed management above, the patient was determined to be safe for discharge.  The patient was in agreement with this plan and all questions regarding their care were answered.  ED return precautions were discussed and the patient will return to  the ED with any significant worsening of condition.   Final Clinical Impression(s) / ED Diagnoses Final diagnoses:  Dehydration  Syncope and collapse    Rx / DC Orders ED Discharge Orders    None       Lucrezia Starch, MD 11/01/19 2001

## 2019-12-28 ENCOUNTER — Ambulatory Visit: Payer: PRIVATE HEALTH INSURANCE | Admitting: Family Medicine

## 2020-03-23 ENCOUNTER — Emergency Department (HOSPITAL_COMMUNITY)
Admission: EM | Admit: 2020-03-23 | Discharge: 2020-03-23 | Disposition: A | Discharge status: Home / Self Care | Payer: Medicaid Other | Attending: Emergency Medicine | Admitting: Emergency Medicine

## 2020-03-23 ENCOUNTER — Encounter (HOSPITAL_COMMUNITY): Payer: Self-pay

## 2020-03-23 ENCOUNTER — Other Ambulatory Visit: Payer: Self-pay

## 2020-03-23 DIAGNOSIS — J45909 Unspecified asthma, uncomplicated: Secondary | ICD-10-CM | POA: Diagnosis not present

## 2020-03-23 DIAGNOSIS — L739 Follicular disorder, unspecified: Secondary | ICD-10-CM | POA: Diagnosis not present

## 2020-03-23 DIAGNOSIS — Z7951 Long term (current) use of inhaled steroids: Secondary | ICD-10-CM | POA: Diagnosis not present

## 2020-03-23 DIAGNOSIS — K2900 Acute gastritis without bleeding: Secondary | ICD-10-CM | POA: Diagnosis not present

## 2020-03-23 DIAGNOSIS — R109 Unspecified abdominal pain: Secondary | ICD-10-CM | POA: Diagnosis present

## 2020-03-23 LAB — I-STAT BETA HCG BLOOD, ED (MC, WL, AP ONLY): I-stat hCG, quantitative: <5 m[IU]/mL (ref <5)

## 2020-03-23 LAB — CBC
HCT: 40.4 % (ref 36.0–46.0)
Hemoglobin: 13.1 g/dL (ref 12.0–15.0)
MCH: 29.2 pg (ref 26.0–34.0)
MCHC: 32.4 g/dL (ref 30.0–36.0)
MCV: 90 fL (ref 80.0–100.0)
Platelets: 311 10*3/uL (ref 150–400)
RBC: 4.49 MIL/uL (ref 3.87–5.11)
RDW: 12.6 % (ref 11.5–15.5)
WBC: 4.8 10*3/uL (ref 4.0–10.5)
nRBC: 0 % (ref 0.0–0.2)

## 2020-03-23 LAB — URINALYSIS, ROUTINE W REFLEX MICROSCOPIC
Bacteria, UA: NONE SEEN
Bilirubin Urine: NEGATIVE
Glucose, UA: NEGATIVE mg/dL
Hgb urine dipstick: NEGATIVE
Ketones, ur: NEGATIVE mg/dL
Nitrite: NEGATIVE
Protein, ur: NEGATIVE mg/dL
Specific Gravity, Urine: 1.023 (ref 1.005–1.030)
pH: 6 (ref 5.0–8.0)

## 2020-03-23 LAB — COMPREHENSIVE METABOLIC PANEL
ALT: 14 U/L (ref 0–44)
AST: 17 U/L (ref 15–41)
Albumin: 4.1 g/dL (ref 3.5–5.0)
Alkaline Phosphatase: 53 U/L (ref 38–126)
Anion gap: 5 (ref 5–15)
BUN: 9 mg/dL (ref 6–20)
CO2: 27 mmol/L (ref 22–32)
Calcium: 9.2 mg/dL (ref 8.9–10.3)
Chloride: 106 mmol/L (ref 98–111)
Creatinine, Ser: 0.75 mg/dL (ref 0.44–1.00)
GFR calc Af Amer: >60 mL/min (ref >60)
GFR calc non Af Amer: >60 mL/min (ref >60)
Glucose, Bld: 88 mg/dL (ref 70–99)
Potassium: 3.7 mmol/L (ref 3.5–5.1)
Sodium: 138 mmol/L (ref 135–145)
Total Bilirubin: 0.8 mg/dL (ref 0.3–1.2)
Total Protein: 7.7 g/dL (ref 6.5–8.1)

## 2020-03-23 LAB — LIPASE, BLOOD: Lipase: 25 U/L (ref 11–51)

## 2020-03-23 MED ORDER — PANTOPRAZOLE SODIUM 20 MG PO TBEC: 20.0000 mg | DELAYED_RELEASE_TABLET | Freq: Every day | ORAL | 0 refills | Status: DC

## 2020-03-23 MED ORDER — MUPIROCIN CALCIUM 2 % NA OINT: TOPICAL_OINTMENT | NASAL | 0 refills | Status: DC

## 2020-03-23 MED ORDER — SODIUM CHLORIDE 0.9% FLUSH: 3.0000 mL | Freq: Once | INTRAVENOUS | Status: DC

## 2020-03-23 MED ORDER — ONDANSETRON 4 MG PO TBDP: ORAL_TABLET | ORAL | 0 refills | Status: DC

## 2020-03-23 NOTE — Discharge Instructions (Signed)
Eat a bland diet for the next week.  This means bland foods that are not fried or fatty.  Stay away from dairy products.  Stay away from acid containing foods such as tomatoes, onions, oranges.  Follow-up with your primary care doctor in 1 week.  Return here as needed for any worsening symptoms.

## 2020-03-23 NOTE — ED Triage Notes (Signed)
Pt reports that she ate at a [employer] 2 weeks ago and hasnt felt the same since. She reports daily nausea. States that she tried to drink "chlorophyll" to detox without relief. Also reports bumps that have popped up on her skin throughout this illness.

## 2020-03-23 NOTE — ED Provider Notes (Signed)
[facility]-EMERGENCY DEPT Provider Note   CSN: [id number] Arrival date & time: 03/23/20  0225     History Chief Complaint  Patient presents with  . Abdominal Pain    Stacey Wyatt is a 24 y.o. female.  Patient is a 24 year old female who presents with nausea and weakness.  She says she ate at a [employer] in [place] about 3 weeks ago for her birthday.  She said about 2 to 3 days after that she started feeling nauseated and had an episode of vomiting.  She has not had any further vomiting since that time but has had some ongoing nausea and intermittent abdominal cramping.  She denies any abdominal pain currently.  She is having normal bowel movements.  She did try some different things to detox including 1 day she drank some chlorophyll which she says caused her to have a large episode of watery diarrhea but she felt better after that.  However now she is starting to feel worse again.  She does have some increased reflux symptoms with some burning in her throat and a dry cough.  No fevers.  No urinary symptoms.  No blood in her stool.  She has noticed that she is having little bumps form all over her body intermittently.  She will noticed the bump that sore and she is able to squeeze some pus out of it and then it heals over.  This also started after this trip to [place].  She has not had any history of MRSA in the past that she knows of.  She did not come into contact with other people with similar symptoms that she knows of.        Past Medical History:  Diagnosis Date  . Allergy   . Asthma   . Seasonal allergies   . Syncope     Patient Active Problem List   Diagnosis Date Noted  . Faintness 10/11/2015  . Seasonal allergies   . Allergy   . Syncope   . Asthma 03/11/2015  . Deviated nasal septum 12/25/2014  . Nasal polyposis 12/25/2014  . Chronic pansinusitis 12/25/2014  . 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 NASAL POLYP SURGERY Bilateral      OB History    Gravida  2   Para  1   Term  1   Preterm      AB  1   Living  1     SAB  1   TAB      Ectopic      Multiple  0   Live Births  1           Family History  Problem Relation Age of Onset  . Hypertension Mother   . Allergic rhinitis Father     Social History   Tobacco Use  . Smoking status: Never Smoker  . Smokeless tobacco: Never Used  Vaping Use  . Vaping Use: Never used  Substance Use Topics  . Alcohol use: No    Alcohol/week: 0.0 standard drinks  . Drug use: No    Home Medications Prior to Admission medications   Medication Sig Start Date End Date Taking? Authorizing Provider  acetaminophen (TYLENOL) 325 MG tablet Take 2 tablets (650 mg total) by mouth every 6 (six) hours as needed (mild discomfortORtemperature>/= 100.4 F). Patient not taking: Reported on 12/08/2018 11/04/18   Cristine Polio, MD  albuterol (PROVENTIL  HFA;VENTOLIN HFA) 108 (90 Base) MCG/ACT inhaler Inhale 2 puffs into the lungs every 6 (six) hours as needed for wheezing or shortness of breath. Patient not taking: Reported on 12/08/2018 07/28/17   Saguier, Ramon Dredge, PA-C  beclomethasone (QVAR REDIHALER) 40 MCG/ACT inhaler 2 puffs as needed for wheezing. Patient not taking: Reported on 12/08/2018 08/11/18   Levie Heritage, DO  CRANBERRY PO Take 3 capsules by mouth daily.    [provider]  ferrous sulfate 325 (65 FE) MG tablet Take 1 tablet (325 mg total) by mouth daily with breakfast. Patient not taking: Reported on 03/13/2019 12/06/18   Levie Heritage, DO  ibuprofen (ADVIL,MOTRIN) 600 MG tablet Take 1 tablet (600 mg total) by mouth every 6 (six) hours as needed for moderate pain or cramping. Patient not taking: Reported on 12/08/2018 11/04/18   Cristine Polio, MD  metroNIDAZOLE (FLAGYL) 500 MG tablet Take 1 tablet (500 mg total) by mouth 2 (two) times daily. Patient not taking: Reported on  11/01/2019 06/14/19   Willodean Rosenthal, MD  metroNIDAZOLE (FLAGYL) 500 MG tablet Take 1 tablet (500 mg total) by mouth 2 (two) times daily. Patient not taking: Reported on 11/01/2019 06/13/19   Bennie Pierini, FNP  mupirocin nasal ointment (BACTROBAN) 2 % Apply in each nostril daily for 14 days 03/23/20   Rolan Bucco, MD  ondansetron (ZOFRAN ODT) 4 MG disintegrating tablet 4mg  ODT q4 hours prn nausea/vomit 03/23/20   03/25/20, MD  pantoprazole (PROTONIX) 20 MG tablet Take 1 tablet (20 mg total) by mouth daily. 03/23/20   03/25/20, MD  Prenat-FeAsp-Meth-FA-DHA w/o A (PRENATE PIXIE) 10-0.6-0.4-200 MG CAPS Take 1 capsule by mouth daily. Patient not taking: Reported on 11/01/2019 03/16/18   05/16/18, MD  Probiotic Product (PROBIOTIC PO) Take 1 capsule by mouth daily.    [provider]    Allergies    Patient has no known allergies.  Review of Systems   Review of Systems  Constitutional: Negative for chills, diaphoresis, fatigue and fever.  HENT: Negative for congestion, rhinorrhea and sneezing.   Eyes: Negative.   Respiratory: Negative for cough, chest tightness and shortness of breath.   Cardiovascular: Negative for chest pain and leg swelling.  Gastrointestinal: Positive for nausea. Negative for abdominal pain, blood in stool, diarrhea and vomiting.  Genitourinary: Negative for difficulty urinating, flank pain, frequency and hematuria.  Musculoskeletal: Negative for arthralgias and back pain.  Skin: Positive for wound. Negative for rash.  Neurological: Negative for dizziness, speech difficulty, weakness, numbness and headaches.    Physical Exam Updated Vital Signs BP (!) 126/92   Pulse 85   Temp 97.9 F (36.6 C)   Resp 15   SpO2 96%   Physical Exam Constitutional:      Appearance: She is well-developed.  HENT:     Head: Normocephalic and atraumatic.  Eyes:     Pupils: Pupils are equal, round, and reactive to light.  Cardiovascular:     Rate  and Rhythm: Normal rate and regular rhythm.     Heart sounds: Normal heart sounds.  Pulmonary:     Effort: Pulmonary effort is normal. No respiratory distress.     Breath sounds: Normal breath sounds. No wheezing or rales.  Chest:     Chest wall: No tenderness.  Abdominal:     General: Bowel sounds are normal.     Palpations: Abdomen is soft.     Tenderness: There is no abdominal tenderness. There is no guarding or rebound.  Musculoskeletal:  General: Normal range of motion.     Cervical back: Normal range of motion and neck supple.  Lymphadenopathy:     Cervical: No cervical adenopathy.  Skin:    General: Skin is warm and dry.     Findings: No rash.     Comments: Patient has some small scabbed over areas across her lower abdomen and one on her left arm.  It appears to be healed areas of small abscesses.  There is no active infections that are visible or suggestions of cellulitis.  Neurological:     Mental Status: She is alert and oriented to person, place, and time.     ED Results / Procedures / Treatments   Labs (all labs ordered are listed, but only abnormal results are displayed) Labs Reviewed  URINALYSIS, ROUTINE W REFLEX MICROSCOPIC - Abnormal; Notable for the following components:      Result Value   APPearance HAZY (*)    Leukocytes,Ua SMALL (*)    All other components within normal limits  LIPASE, BLOOD  COMPREHENSIVE METABOLIC PANEL  CBC  I-STAT BETA HCG BLOOD, ED ([facility], [facility], [facility] ONLY)    EKG None  Radiology No results found.  Procedures Procedures (including critical care time)  Medications Ordered in ED Medications  sodium chloride flush (NS) 0.9 % injection 3 mL (has no administration in time range)    ED Course  I have reviewed the triage vital signs and the nursing notes.  Pertinent labs & imaging results that were available during my care of the patient were reviewed by me and considered in my medical decision making (see chart for  details).    MDM Rules/Calculators/A&P                          Patient presents with some nausea after eating and presenting stay house.  She had one episode of vomiting but no ongoing vomiting.  Her abdomen is nontender.  Her labs are nonconcerning.  There is no clinical suggestions of cholecystitis or appendicitis.  No evidence of pancreatitis.  She does have some associated reflux symptoms and I feel for symptomatic improvement, we will start her on Protonix and some Zofran.  Her pregnancy test is negative.  Her urine has some white cells but no bacteria and she is not symptomatic for UTI.  She does have some small healed abscesses.  I will give her a prescription for some Bactroban cream to use as needed and advised her to to use it nasally to help cut down on the bacterial load.  I also discussed Clorox baths and only to use this with a small amount of Clorox and only to use this when all the wounds are healed.  She will follow-up with her PCP within the next week for recheck.  Return precautions were given. Final Clinical Impression(s) / ED Diagnoses Final diagnoses:  Acute gastritis without hemorrhage, unspecified gastritis type  Folliculitis    Rx / DC Orders ED Discharge Orders         Ordered    mupirocin nasal ointment (BACTROBAN) 2 %     Discontinue  Reprint     03/23/20 0733    ondansetron (ZOFRAN ODT) 4 MG disintegrating tablet     Discontinue  Reprint     03/23/20 0733    pantoprazole (PROTONIX) 20 MG tablet  Daily     Discontinue  Reprint     03/23/20 0733  Malvin Johns, MD 03/23/20 [phone]

## 2020-05-03 ENCOUNTER — Encounter: Payer: Self-pay | Admitting: Obstetrics & Gynecology

## 2020-05-03 ENCOUNTER — Other Ambulatory Visit: Payer: Self-pay

## 2020-05-03 ENCOUNTER — Ambulatory Visit (INDEPENDENT_AMBULATORY_CARE_PROVIDER_SITE_OTHER): Payer: Medicaid Other | Admitting: Obstetrics & Gynecology

## 2020-05-03 DIAGNOSIS — N760 Acute vaginitis: Secondary | ICD-10-CM | POA: Diagnosis not present

## 2020-05-03 DIAGNOSIS — Z09 Encounter for follow-up examination after completed treatment for conditions other than malignant neoplasm: Secondary | ICD-10-CM

## 2020-05-03 DIAGNOSIS — Z30011 Encounter for initial prescription of contraceptive pills: Secondary | ICD-10-CM | POA: Diagnosis not present

## 2020-05-03 MED ORDER — METRONIDAZOLE 500 MG PO TABS: 500.0000 mg | ORAL_TABLET | Freq: Two times a day (BID) | ORAL | 0 refills | Status: DC

## 2020-05-03 MED ORDER — NORETHIN ACE-ETH ESTRAD-FE 1-20 MG-MCG(24) PO TABS: 1.0000 | ORAL_TABLET | Freq: Every day | ORAL | 11 refills | Status: DC

## 2020-05-03 NOTE — Patient Instructions (Signed)
Oral Contraception Use Oral contraceptive pills (OCPs) are medicines that you take to prevent pregnancy. OCPs work by:  Preventing the ovaries from releasing eggs.  Thickening mucus in the lower part of the uterus (cervix), which prevents sperm from entering the uterus.  Thinning the lining of the uterus (endometrium), which prevents a fertilized egg from attaching to the endometrium. OCPs are highly effective when taken exactly as prescribed. However, OCPs do not prevent sexually transmitted infections (STIs). Safe sex practices, such as using condoms while on an OCP, can help prevent STIs. Before taking OCPs, you may have a physical exam, blood test, and Pap test. A Pap test involves taking a sample of cells from your cervix to check for cancer. Discuss with your health care provider the possible side effects of the OCP you may be prescribed. When you start an OCP, be aware that it can take 2-3 months for your body to adjust to changes in hormone levels. How to take oral contraceptive pills Follow instructions from your health care provider about how to start taking your first cycle of OCPs. Your health care provider may recommend that you:  Start the pill on day 1 of your menstrual period. If you start at this time, you will not need any backup form of birth control (contraception), such as condoms.  Start the pill on the first Sunday after your menstrual period or on the day you get your prescription. In these cases, you will need to use backup contraception for the first week.  Start the pill at any time of your cycle. ? If you take the pill within 5 days of the start of your period, you will not need a backup form of contraception. ? If you start at any other time of your menstrual cycle, you will need to use another form of contraception for 7 days. If your OCP is the type called a minipill, it will protect you from pregnancy after taking it for 2 days (48 hours), and you can stop using  backup contraception after that time. After you have started taking OCPs:  If you forget to take 1 pill, take it as soon as you remember. Take the next pill at the regular time.  If you miss 2 or more pills, call your health care provider. Different pills have different instructions for missed doses. Use backup birth control until your next menstrual period starts.  If you use a 28-day pack that contains inactive pills and you miss 1 of the last 7 pills (pills with no hormones), throw away the rest of the non-hormone pills and start a new pill pack. No matter which day you start the OCP, you will always start a new pack on that same day of the week. Have an extra pack of OCPs and a backup contraceptive method available in case you miss some pills or lose your OCP pack. Follow these instructions at home:  Do not use any products that contain nicotine or tobacco, such as cigarettes and e-cigarettes. If you need help quitting, ask your health care provider.  Always use a condom to protect against STIs. OCPs do not protect against STIs.  Use a calendar to mark the days of your menstrual period.  Read the information and directions that came with your OCP. Talk to your health care provider if you have questions. Contact a health care provider if:  You develop nausea and vomiting.  You have abnormal vaginal discharge or bleeding.  You develop a rash.    You miss your menstrual period. Depending on the type of OCP you are taking, this may be a sign of pregnancy. Ask your health care provider for more information.  You are losing your hair.  You need treatment for mood swings or depression.  You get dizzy when taking the OCP.  You develop acne after taking the OCP.  You become pregnant or think you may be pregnant.  You have diarrhea, constipation, and abdominal pain or cramps.  You miss 2 or more pills. Get help right away if:  You develop chest pain.  You develop shortness of  breath.  You have an uncontrolled or severe headache.  You develop numbness or slurred speech.  You develop visual or speech problems.  You develop pain, redness, and swelling in your legs.  You develop weakness or numbness in your arms or legs. Summary  Oral contraceptive pills (OCPs) are medicines that you take to prevent pregnancy.  OCPs do not prevent sexually transmitted infections (STIs). Always use a condom to protect against STIs.  When you start an OCP, be aware that it can take 2-3 months for your body to adjust to changes in hormone levels.  Read all the information and directions that come with your OCP. This information is not intended to replace advice given to you by your health care provider. Make sure you discuss any questions you have with your health care provider. Document Revised: 01/13/2019 Document Reviewed: 11/02/2016 Elsevier Patient Education  2020 [employer] Inc[employer]  

## 2020-05-03 NOTE — Progress Notes (Signed)
GYNECOLOGY OFFICE VISIT NOTE  History:   Stacey Wyatt is a 24 y.o. [phone] here today for follow up after recent medical TAB on 04/27/2020 at around six weeks gestation, done at [facility].  Had expected heavy bleeding for a few days, now has bleeding like a period. No pain, doing well emotionally. Concerned about having recurrent BV and odor, had this prior to the termination and vaginal odor has worsened since she started bleeding.  She denies any pelvic pain or other concerns.    Past Medical History:  Diagnosis Date  . Allergy   . Asthma   . Seasonal allergies   . Syncope     Past Surgical History:  Procedure Laterality Date  . NASAL POLYP SURGERY Bilateral     The following portions of the patient's history were reviewed and updated as appropriate: allergies, current medications, past family history, past medical history, past social history, past surgical history and problem list.   Health Maintenance:  Normal pap on 07/16/2017.   Review of Systems:  Pertinent items noted in HPI and remainder of comprehensive ROS otherwise negative.  Physical Exam:  BP (!) 130/94   Pulse 76   Ht 5\' 9"  (1.753 m)   Wt 192 lb (87.1 kg)   BMI 28.35 kg/m  CONSTITUTIONAL: Well-developed, well-nourished female in no acute distress.  HEENT:  Normocephalic, atraumatic. External right and left ear normal. No scleral icterus.  NECK: Normal range of motion, supple, no masses noted on observation SKIN: No rash noted. Not diaphoretic. No erythema. No pallor. MUSCULOSKELETAL: Normal range of motion. No edema noted. NEUROLOGIC: Alert and oriented to person, place, and time. Normal muscle tone coordination. No cranial nerve deficit noted. PSYCHIATRIC: Normal mood and affect. Normal behavior. Normal judgment and thought content. CARDIOVASCULAR: Normal heart rate noted RESPIRATORY: Effort and breath sounds normal, no problems with respiration noted ABDOMEN: No masses noted. No other overt  distention noted.   PELVIC: Deferred     Assessment and Plan:      1. Follow-up visit after therapeutic abortion (medical) Doing well.  She was told to let know of any concerns or if she decides to talk to our counselor (this was offered and she declined)  2. Vaginal odor in the setting of recurrent vaginitis episodes Odor can be exacerbated by post abortion bleeding. Will presumptively treat for now. If symptoms continue after the bleeding, will need evaluation. Patient also plans to star boric acid regimen after her bleeding subsides.  Proper vulvar hygiene emphasized: discussed avoidance of perfumed soaps, detergents, lotions and any type of douches; in addition to wearing cotton underwear and no underwear at night.  Also recommended cleaning front to back, voiding and cleaning up after intercourse.   3. Initiation of oral contraception The use of the oral contraceptive has been fully discussed with the patient. Has used in the past without issues.  She should back up the pill with a condom during any cycle in which antibiotics are prescribed, and during the first cycle as well. The need for additional protection, such as a condom, to prevent exposure to sexually transmitted diseases has also been discussed- the patient has been clearly reminded that OCP's cannot protect her against diseases such as HIV and others. She understands and wishes to take the medication as prescribed.She was given a prescription for Loestrin and will return in 2 months for BP and contraceptive check. - Norethindrone Acetate-Ethinyl Estrad-FE (LOESTRIN 24 FE) 1-20 MG-MCG(24) tablet; Take 1 tablet by mouth daily.  Dispense: 30 tablet; Refill: 11  Return in about 2 months (around 07/04/2020) for Annual pap smear and OCP check.    Total face-to-face time with patient: 20 minutes.  Over 50% of encounter was spent on counseling and coordination of care.   Jaynie Collins, MD, FACOG Obstetrician & Gynecologist, [facility] for [employer], [facility]

## 2020-05-06 ENCOUNTER — Telehealth: Payer: Self-pay

## 2020-05-06 DIAGNOSIS — B9689 Other specified bacterial agents as the cause of diseases classified elsewhere: Secondary | ICD-10-CM

## 2020-05-06 MED ORDER — METRONIDAZOLE 500 MG PO TABS: 500.0000 mg | ORAL_TABLET | Freq: Two times a day (BID) | ORAL | 0 refills | Status: DC

## 2020-05-06 NOTE — Telephone Encounter (Signed)
Pt called requesting a Rx for BV. Flagyl 500 mg was sent to pt's pharmacy.  Ahtziry Saathoff l Sylvanus Telford, CMA

## 2020-07-04 ENCOUNTER — Ambulatory Visit (INDEPENDENT_AMBULATORY_CARE_PROVIDER_SITE_OTHER): Payer: PRIVATE HEALTH INSURANCE | Admitting: Family Medicine

## 2020-07-04 ENCOUNTER — Other Ambulatory Visit: Payer: Self-pay

## 2020-07-04 ENCOUNTER — Other Ambulatory Visit (HOSPITAL_COMMUNITY)
Admission: RE | Admit: 2020-07-04 | Discharge: 2020-07-04 | Disposition: A | Discharge status: Home / Self Care | Payer: PRIVATE HEALTH INSURANCE | Source: Ambulatory Visit | Attending: Family Medicine | Admitting: Family Medicine

## 2020-07-04 ENCOUNTER — Encounter: Payer: Self-pay | Admitting: Family Medicine

## 2020-07-04 VITALS — BP 130/88 | HR 72 | Ht 69.0 in | Wt 184.0 lb

## 2020-07-04 DIAGNOSIS — N760 Acute vaginitis: Secondary | ICD-10-CM

## 2020-07-04 DIAGNOSIS — Z01419 Encounter for gynecological examination (general) (routine) without abnormal findings: Secondary | ICD-10-CM

## 2020-07-04 DIAGNOSIS — B9689 Other specified bacterial agents as the cause of diseases classified elsewhere: Secondary | ICD-10-CM

## 2020-07-04 MED ORDER — METRONIDAZOLE 0.75 % VA GEL: 1.0000 | Freq: Every day | VAGINAL | 5 refills | Status: DC

## 2020-07-04 NOTE — Progress Notes (Signed)
GYNECOLOGY ANNUAL PREVENTATIVE CARE ENCOUNTER NOTE  Subjective:   Stacey Wyatt is a 24 y.o. G24P1021 female here for a routine annual gynecologic exam.  Current complaints: recurrent BV.   Denies abnormal vaginal bleeding, discharge, pelvic pain, problems with intercourse or other gynecologic concerns.    Gynecologic History Patient's last menstrual period was 06/30/2020. Patient is not sexually active  Contraception: abstinence Last Pap: 2018. Results were: normal Last mammogram: n/a  Obstetric History OB History  Gravida Para Term Preterm AB Living  3 1 1   2 1   SAB TAB Ectopic Multiple Live Births  1 1   0 1    # Outcome Date GA Lbr Len/2nd Weight Sex Delivery Anes PTL Lv  3 TAB 04/27/20          2 Term 11/02/18 [redacted]w[redacted]d 07:39 / 00:18 7 lb 6.3 oz (3.355 kg) F Vag-Spont EPI, Local  LIV  1 SAB 12/21/16 [redacted]w[redacted]d           Past Medical History:  Diagnosis Date  . Allergy   . Asthma   . Seasonal allergies   . Syncope     Past Surgical History:  Procedure Laterality Date  . NASAL POLYP SURGERY Bilateral     Current Outpatient Medications on File Prior to Visit  Medication Sig Dispense Refill  . CRANBERRY PO Take 3 capsules by mouth daily. (Patient not taking: Reported on 05/03/2020)    . metroNIDAZOLE (FLAGYL) 500 MG tablet Take 1 tablet (500 mg total) by mouth 2 (two) times daily. (Patient not taking: Reported on 07/04/2020) 14 tablet 0  . mupirocin nasal ointment (BACTROBAN) 2 % Apply in each nostril daily for 14 days (Patient not taking: Reported on 05/03/2020) 1 g 0  . Norethindrone Acetate-Ethinyl Estrad-FE (LOESTRIN 24 FE) 1-20 MG-MCG(24) tablet Take 1 tablet by mouth daily. (Patient not taking: Reported on 07/04/2020) 30 tablet 11  . Probiotic Product (PROBIOTIC PO) Take 1 capsule by mouth daily. (Patient not taking: Reported on 05/03/2020)     No current facility-administered medications on file prior to visit.    No Known Allergies  Social History    Socioeconomic History  . Marital status: Single    Spouse name: Not on file  . Number of children: Not on file  . Years of education: Not on file  . Highest education level: Not on file  Occupational History  . Not on file  Tobacco Use  . Smoking status: Never Smoker  . Smokeless tobacco: Never Used  Vaping Use  . Vaping Use: Never used  Substance and Sexual Activity  . Alcohol use: No    Alcohol/week: 0.0 standard drinks  . Drug use: No  . Sexual activity: Not Currently    Birth control/protection: None  Other Topics Concern  . Not on file  Social History Narrative  . Not on file   Social Determinants of Health   Financial Resource Strain:   . Difficulty of Paying Living Expenses: Not on file  Food Insecurity:   . Worried About 05/05/2020 in the Last Year: Not on file  . Ran Out of Food in the Last Year: Not on file  Transportation Needs:   . Lack of Transportation (Medical): Not on file  . Lack of Transportation (Non-Medical): Not on file  Physical Activity:   . Days of Exercise per Week: Not on file  . Minutes of Exercise per Session: Not on file  Stress:   . Feeling of Stress :  Not on file  Social Connections:   . Frequency of Communication with Friends and Family: Not on file  . Frequency of Social Gatherings with Friends and Family: Not on file  . Attends Religious Services: Not on file  . Active Member of Clubs or Organizations: Not on file  . Attends Banker Meetings: Not on file  . Marital Status: Not on file  Intimate Partner Violence:   . Fear of Current or Ex-Partner: Not on file  . Emotionally Abused: Not on file  . Physically Abused: Not on file  . Sexually Abused: Not on file    Family History  Problem Relation Age of Onset  . Hypertension Mother   . Allergic rhinitis Father     The following portions of the patient's history were reviewed and updated as appropriate: allergies, current medications, past family  history, past medical history, past social history, past surgical history and problem list.  Review of Systems Pertinent items are noted in HPI.   Objective:  BP 130/88   Pulse 72   Ht 5\' 9"  (1.753 m)   Wt 184 lb (83.5 kg)   LMP 06/30/2020   BMI 27.17 kg/m  Wt Readings from Last 3 Encounters:  07/04/20 184 lb (83.5 kg)  05/03/20 192 lb (87.1 kg)  11/01/19 184 lb (83.5 kg)     Chaperone present during exam  CONSTITUTIONAL: Well-developed, well-nourished female in no acute distress.  HENT:  Normocephalic, atraumatic, External right and left ear normal. Oropharynx is clear and moist EYES: Conjunctivae and EOM are normal. Pupils are equal, round, and reactive to light. No scleral icterus.  NECK: Normal range of motion, supple, no masses.  Normal thyroid.   CARDIOVASCULAR: Normal heart rate noted, regular rhythm RESPIRATORY: Clear to auscultation bilaterally. Effort and breath sounds normal, no problems with respiration noted. BREASTS: Symmetric in size. No masses, skin changes, nipple drainage, or lymphadenopathy. ABDOMEN: Soft, normal bowel sounds, no distention noted.  No tenderness, rebound or guarding.  PELVIC: Normal appearing external genitalia; normal appearing vaginal mucosa and cervix.  No abnormal discharge noted.  Normal uterine size, no other palpable masses, no uterine or adnexal tenderness. MUSCULOSKELETAL: Normal range of motion. No tenderness.  No cyanosis, clubbing, or edema.  2+ distal pulses. SKIN: Skin is warm and dry. No rash noted. Not diaphoretic. No erythema. No pallor. NEUROLOGIC: Alert and oriented to person, place, and time. Normal reflexes, muscle tone coordination. No cranial nerve deficit noted. PSYCHIATRIC: Normal mood and affect. Normal behavior. Normal judgment and thought content.  Assessment:  Annual gynecologic examination with pap smear   Plan:  1. Well Woman Exam Will follow up results of pap smear and manage accordingly. - Cytology - PAP(  [place])  2. BV (bacterial vaginosis) Recurrent. Metrogel. Patient will use boric acid. Discussed using mild soap, using probiotics, condoms if sexually active. - metroNIDAZOLE (METROGEL) 0.75 % vaginal gel; Place 1 Applicatorful vaginally at bedtime. Apply one applicatorful to vagina at bedtime for 10 days, then twice a week for 6 months.  Dispense: 70 g; Refill: 5   Routine preventative health maintenance measures emphasized. Please refer to After Visit Summary for other counseling recommendations.    11/03/19, DO [facility]

## 2020-07-05 LAB — CYTOLOGY - PAP: Diagnosis: NEGATIVE

## 2020-10-21 ENCOUNTER — Other Ambulatory Visit: Payer: Self-pay | Admitting: Family Medicine

## 2020-10-21 DIAGNOSIS — B9689 Other specified bacterial agents as the cause of diseases classified elsewhere: Secondary | ICD-10-CM

## 2020-10-21 DIAGNOSIS — N760 Acute vaginitis: Secondary | ICD-10-CM

## 2020-10-23 ENCOUNTER — Other Ambulatory Visit: Payer: Self-pay | Admitting: Family Medicine

## 2020-10-23 DIAGNOSIS — B9689 Other specified bacterial agents as the cause of diseases classified elsewhere: Secondary | ICD-10-CM

## 2020-10-23 DIAGNOSIS — N760 Acute vaginitis: Secondary | ICD-10-CM

## 2020-10-24 ENCOUNTER — Telehealth: Payer: Self-pay

## 2020-10-24 NOTE — Telephone Encounter (Signed)
We received a refill request for Metrogel from Pt's pharmacy. Called pt to see if she was having symptoms of BV. Pt states the Metrogel was on auto refill and she does not need a refill at this time. Advised pt to call the office if she starts having symptoms. Understanding was voiced. Kabrea Seeney l Mahlik Lenn, CMA

## 2020-10-25 ENCOUNTER — Other Ambulatory Visit: Payer: Self-pay | Admitting: Family Medicine

## 2020-10-25 DIAGNOSIS — N760 Acute vaginitis: Secondary | ICD-10-CM

## 2021-03-14 ENCOUNTER — Other Ambulatory Visit: Payer: Self-pay

## 2021-03-14 DIAGNOSIS — B9689 Other specified bacterial agents as the cause of diseases classified elsewhere: Secondary | ICD-10-CM

## 2021-03-17 MED ORDER — METRONIDAZOLE 500 MG PO TABS: 500.0000 mg | ORAL_TABLET | Freq: Two times a day (BID) | ORAL | 0 refills | Status: DC

## 2021-11-11 ENCOUNTER — Inpatient Hospital Stay
Admit: 2021-11-11 | Discharge: 2021-11-12 | Disposition: A | Discharge status: Home / Self Care | Payer: PRIVATE HEALTH INSURANCE | Attending: Emergency Medicine

## 2021-11-11 ENCOUNTER — Emergency Department: Payer: MEDICAID

## 2021-11-11 ENCOUNTER — Emergency Department: Admit: 2021-11-12 | Payer: MEDICAID

## 2021-11-11 DIAGNOSIS — U071 COVID-19: Secondary | ICD-10-CM

## 2021-11-11 NOTE — ED Provider Notes (Signed)
EMERGENCY DEPARTMENT ENCOUNTER    Pt Name: Andrea Good  MRN: [id number]  Birthdate 15-Apr-1996  Date of evaluation: 11/11/21  CHIEF COMPLAINT       Chief Complaint   Patient presents with    Headache    Chills    Fatigue     HISTORY OF PRESENT ILLNESS   26 year old female presents for complaints of cough, fatigue and headache.  Patient reports that she has had symptoms for the last 2 days.  States that today her headache was much worse and she presented for evaluation reports it is mostly in the front of her head, describes as an aching and throbbing.  She denies anything making it significantly worse she reports that it has been improving somewhat with ibuprofen.  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  She denies any associated chest pain or shortness of breath denies any abdominal pain nausea or vomiting.  She denies any numbness tingling or weakness in the extremities, she denies any vision changes    The history is provided by the patient.         REVIEW OF SYSTEMS     Review of Systems   Constitutional:  Positive for fatigue. Negative for fever.   HENT:  Negative for congestion and ear pain.    Eyes:  Negative for pain.   Respiratory:  Positive for cough. Negative for shortness of breath.    Cardiovascular:  Negative for chest pain, palpitations and leg swelling.   Gastrointestinal:  Negative for abdominal pain.   Genitourinary:  Negative for dysuria and flank pain.   Musculoskeletal:  Negative for back pain.   Skin:  Negative for color change.   Neurological:  Positive for headaches. Negative for numbness.   Psychiatric/Behavioral:  Negative for confusion.    All other systems reviewed and are negative.  PASTMEDICAL HISTORY     Past Medical History:   Diagnosis Date    Asthma     Seasonal allergies      Past Problem List  There is no problem list on file for this  patient.    SURGICAL HISTORY       Past Surgical History:   Procedure Laterality Date    ADENOIDECTOMY       CURRENT MEDICATIONS       Discharge Medication List as of 11/11/2021 11:44 PM        ALLERGIES     has No Known Allergies.  FAMILY HISTORY     has no family status information on file.      SOCIAL HISTORY       Social History     Tobacco Use    Smoking status: Never    Smokeless tobacco: Never   Substance Use Topics    Alcohol use: No    Drug use: No     PHYSICAL EXAM     INITIAL VITALS: BP (!) 129/94    Pulse 80    Temp 99.8 ??F (37.7 ??C) (Oral)    Resp 16    Ht 5\' 10"  (1.778 m)    Wt 160 lb (72.6 kg)    LMP 11/11/2021 (Exact Date)    SpO2 98%    BMI 22.96 kg/m??    Physical Exam  Vitals and nursing note reviewed.   Constitutional:       General:  She is not in acute distress.     Appearance: Normal appearance. She is not toxic-appearing.   HENT:      Head: Normocephalic and atraumatic.      Nose: Congestion present.      Comments: Significant turbinate swelling noted bilaterally     Mouth/Throat:      Mouth: Mucous membranes are moist.      Pharynx: Oropharynx is clear.   Eyes:      General: No visual field deficit.     Extraocular Movements: Extraocular movements intact.      Conjunctiva/sclera: Conjunctivae normal.      Pupils: Pupils are equal, round, and reactive to light.   Cardiovascular:      Rate and Rhythm: Normal rate and regular rhythm.      Pulses: Normal pulses.      Heart sounds: Normal heart sounds.   Pulmonary:      Effort: Pulmonary effort is normal.      Breath sounds: Normal breath sounds.   Abdominal:      General: Bowel sounds are normal. There is no distension.      Palpations: Abdomen is soft.      Tenderness: There is no abdominal tenderness.   Musculoskeletal:         General: Normal range of motion.      Cervical back: Normal range of motion. No spinous process tenderness or muscular tenderness.   Skin:     General: Skin is warm and dry.      Capillary Refill: Capillary refill takes  less than 2 seconds.   Neurological:      General: No focal deficit present.      Mental Status: She is alert and oriented to person, place, and time.      Cranial Nerves: Cranial nerves 2-12 are intact. No cranial nerve deficit, dysarthria or facial asymmetry.      Sensory: Sensation is intact. No sensory deficit.      Motor: Motor function is intact. No weakness.   Psychiatric:         Mood and Affect: Mood normal.         Thought Content: Thought content does not include homicidal or suicidal ideation.       MEDICAL DECISION MAKING / ED COURSE:   26 year old female presents for complaints of headache, fatigue and chills.  On initial exam patient in no acute distress, vitals are stable, GCS 15, no neurodeficits on exam      1)  Number and Complexity of Problems Addressed at this Encounter  Problem List This Visit: Fatigue, headache, chills    Differential Diagnosis: Viral illness, COVID, migraine    Diagnoses Considered but Do Not Suspect: Intracranial hemorrhage      3)  Treatment and Disposition         We will check labs, will check imaging reported headache we will check COVID and flu testing    Patient found to be COVID-positive remaining labs were unremarkable CT head was negative    Suspect patient's symptoms likely related to COVID infection    Given that patient satting well on room air, reporting improvement after meds feel that she is stable for discharge home at this time will provide information for PCP follow-up as well as ENT follow-up    Patient repeat assessment: Patient reports symptom has improved after meds she is resting comfortably    Disposition discussion with patient/family, Shared Decision Making: Results were discussed with patient and mom discussed  need for quarantined, discussed supportive care discussed need for follow-up with PCP and ENT and return precautions, patient and mom voiced understanding and are comfortable with plan and discharge    Patient has a history of asthma  requesting refill of her albuterol inhaler    Patient/Guardian was informed of their diagnosis and told to follow up with PCP & ENT in 1-3 days. Patient demonstrates understanding and agreement with the plan. They were given the opportunity to ask questions and those questions were answered to the best of our ability with the available information. Patient/Guardian told to return to the ED for any new, worsening, changing or persistent symptoms.       This dictation was prepared using Air traffic controller.     CRITICAL CARE:       PROCEDURES:    Procedures      DATA FOR LAB AND RADIOLOGY TESTS ORDERED BELOW ARE REVIEWED BY THE ED CLINICIAN:    RADIOLOGY: All x-rays, CT, MRI, and formal ultrasound images (except ED bedside ultrasound) are read by the radiologist, see reports below, unless otherwise noted in MDM or here.  Reports below are reviewed by myself.  CT Head W/O Contrast   Preliminary Result   No evidence of intracranial hemorrhage or any other definable intracranial   acute process.      There is no definable focal abnormality or acute process in the brain.      Evidence of marked diffuse chronic sinusitis involving visualized frontal,   ethmoidal and sphenoidal sinuses.  Maxillary sinuses are not included with   this study.  Marked mucosal thickenings, and/or complete opacification of   most of the sinuses as described in body of report.      Follow-up complete CT scan of paranasal sinuses recommended.      RECOMMENDATIONS:   Follow-up, nonemergent CT scan of paranasal sinuses.             LABS: Lab orders shown below, the results are reviewed by myself, and all abnormals are listed below.  Labs Reviewed   COVID-19 & INFLUENZA COMBO - Abnormal; Notable for the following components:       Result Value    SARS-CoV-2 RNA, RT PCR DETECTED (*)     All other components within normal limits   CBC WITH AUTO DIFFERENTIAL - Abnormal; Notable for the following components:    WBC 3.1 (*)     Seg  Neutrophils 70 (*)     Lymphocytes 17 (*)     Monocytes 13 (*)     Absolute Lymph # 0.53 (*)     All other components within normal limits   COMPREHENSIVE METABOLIC PANEL - Abnormal; Notable for the following components:    Potassium 3.3 (*)     All other components within normal limits   URINALYSIS WITH REFLEX TO CULTURE - Abnormal; Notable for the following components:    Color, UA Dark Yellow (*)     Turbidity UA Cloudy (*)     Urine Hgb LARGE (*)     Protein, UA 1+ (*)     Leukocyte Esterase, Urine TRACE (*)     All other components within normal limits   MICROSCOPIC URINALYSIS - Abnormal; Notable for the following components:    Bacteria, UA FEW (*)     Mucus, UA 2+ (*)     All other components within normal limits   CULTURE, URINE   LIPASE   MAGNESIUM   PREGNANCY, URINE  Vitals Reviewed:    Vitals:    11/11/21 1900 11/11/21 2210 11/11/21 2319   BP: 134/87 109/82 (!) 129/94   Pulse: 97 91 80   Resp: 16  16   Temp: 98.5 ??F (36.9 ??C) (!) 101.4 ??F (38.6 ??C) 99.8 ??F (37.7 ??C)   TempSrc: Temporal Oral Oral   SpO2: 100% 100% 98%   Weight: 160 lb (72.6 kg)     Height: 5\' 10"  (1.778 m)       MEDICATIONS GIVEN TO PATIENT THIS ENCOUNTER:  Orders Placed This Encounter   Medications    dexamethasone (PF) (DECADRON) injection 10 mg    diphenhydrAMINE (BENADRYL) injection 25 mg    prochlorperazine (COMPAZINE) injection 10 mg    0.9 % sodium chloride bolus    phenylephrine (NEO-SYNEPHRINE) 0.5 % nasal spray 2 spray    acetaminophen (TYLENOL) tablet 1,000 mg    potassium chloride (KLOR-CON M) extended release tablet 40 mEq    ketorolac (TORADOL) injection 30 mg    albuterol sulfate HFA (VENTOLIN HFA) 108 (90 Base) MCG/ACT inhaler     Sig: Inhale 2 puffs into the lungs every 6 hours as needed for Wheezing     Dispense:  18 g     Refill:  0    fluticasone (FLONASE) 50 MCG/ACT nasal spray     Sig: 2 sprays by Each Nostril route daily     Dispense:  16 g     Refill:  0    cetirizine (ZYRTEC) 10 MG tablet     Sig: Take 1  tablet by mouth daily     Dispense:  30 tablet     Refill:  0     DISCHARGE PRESCRIPTIONS:  Discharge Medication List as of 11/11/2021 11:44 PM        START taking these medications    Details   albuterol sulfate HFA (VENTOLIN HFA) 108 (90 Base) MCG/ACT inhaler Inhale 2 puffs into the lungs every 6 hours as needed for Wheezing, Disp-18 g, R-0Normal      fluticasone (FLONASE) 50 MCG/ACT nasal spray 2 sprays by Each Nostril route daily, Disp-16 g, R-0Normal           PHYSICIAN CONSULTS ORDERED THIS ENCOUNTER:  None  FINAL IMPRESSION      1. COVID-19          DISPOSITION/PLAN   DISPOSITION Decision To Discharge 11/11/2021 11:26:35 PM      OUTPATIENT FOLLOW UP THE PATIENT:  [facility]  [address]  [place] [id number]  [phone]    As needed, If symptoms worsen    [facility]  [address]  [place] [id number]  [phone]  Schedule an appointment as soon as possible for a visit       Adolph PollackVincent S Toma, MD  [address]  [place] [id number]  [phone]    Schedule an appointment as soon as possible for a visit       Stefan Churchathan R Octave [place]rose, DO       Stefan Churchathan R Pennye Beeghly, DO  11/12/21 [phone]

## 2021-11-11 NOTE — ED Notes (Signed)
Report given to [facility], RN from ED.   Report method in person   The following was reviewed with receiving RN:   Current vital signs:  BP 109/82    Pulse 91    Temp (!) 101.4 ??F (38.6 ??C) (Oral)    Resp 16    Ht 5\' 10"  (1.778 m)    Wt 160 lb (72.6 kg)    LMP 11/11/2021 (Exact Date)    SpO2 100%    BMI 22.96 kg/m??                MEWS Score: 1     Any medication or safety alerts were reviewed. Any pending diagnostics and notifications were also reviewed, as well as any safety concerns or issues, abnormal labs, abnormal imaging, and abnormal assessment findings. Questions were answered.            01/09/2022, RN  11/11/21 [phone]

## 2021-11-12 LAB — COVID-19 & INFLUENZA COMBO
Influenza A: NOT DETECTED
Influenza B: NOT DETECTED
SARS-CoV-2 RNA, RT PCR: DETECTED — AB

## 2021-11-12 LAB — URINALYSIS WITH REFLEX TO CULTURE
Bilirubin Urine: NEGATIVE
Glucose, Ur: NEGATIVE
Ketones, Urine: NEGATIVE
Nitrite, Urine: NEGATIVE
Specific Gravity, UA: 1.028 (ref 1.000–1.030)
Urobilinogen, Urine: NORMAL
pH, UA: 6 (ref 5.0–8.0)

## 2021-11-12 LAB — COMPREHENSIVE METABOLIC PANEL
ALT: 9 U/L (ref 5–33)
AST: 14 U/L (ref <32)
Albumin: 4.2 g/dL (ref 3.5–5.2)
Alkaline Phosphatase: 57 U/L (ref 35–104)
Anion Gap: 11 mmol/L (ref 9–17)
BUN: 6 mg/dL (ref 6–20)
CO2: 25 mmol/L (ref 20–31)
Calcium: 9 mg/dL (ref 8.6–10.4)
Chloride: 101 mmol/L (ref 98–107)
Creatinine: 0.69 mg/dL (ref 0.50–0.90)
Est, Glom Filt Rate: >60 mL/min/{1.73_m2} (ref >60)
Glucose: 95 mg/dL (ref 70–99)
Potassium: 3.3 mmol/L — ABNORMAL LOW (ref 3.7–5.3)
Sodium: 137 mmol/L (ref 135–144)
Total Bilirubin: 0.7 mg/dL (ref 0.3–1.2)
Total Protein: 7.1 g/dL (ref 6.4–8.3)

## 2021-11-12 LAB — CBC WITH AUTO DIFFERENTIAL
Basophils %: 0 % (ref 0–2)
Basophils Absolute: 0 10*3/uL (ref 0.0–0.2)
Eosinophils %: 0 % (ref 0–4)
Eosinophils Absolute: 0 10*3/uL (ref 0.0–0.4)
Hematocrit: 37.4 % (ref 36–46)
Hemoglobin: 12.4 g/dL (ref 12.0–16.0)
Lymphocytes Absolute: 0.53 10*3/uL — ABNORMAL LOW (ref 1.0–4.8)
Lymphocytes: 17 % — ABNORMAL LOW (ref 24–44)
MCH: 29.4 pg (ref 26–34)
MCHC: 33.2 g/dL (ref 31–37)
MCV: 88.6 fL (ref 80–100)
MPV: 6.9 fL (ref 6.0–12.0)
Monocytes %: 13 % — ABNORMAL HIGH (ref 1–7)
Monocytes Absolute: 0.4 10*3/uL (ref 0.1–1.3)
Morphology: NORMAL
Neutrophils Absolute: 2.17 10*3/uL (ref 1.3–9.1)
Platelets: 226 10*3/uL (ref 150–450)
RBC: 4.21 m/uL (ref 4.0–5.2)
RDW: 13.3 % (ref 11.5–14.9)
Seg Neutrophils: 70 % — ABNORMAL HIGH (ref 36–66)
WBC: 3.1 10*3/uL — ABNORMAL LOW (ref 3.5–11.0)

## 2021-11-12 LAB — MICROSCOPIC URINALYSIS
Casts UA: 0 /LPF
Casts UA: 6 /LPF
Epithelial Cells, UA: 0 /HPF
WBC, UA: 21 /HPF

## 2021-11-12 LAB — MAGNESIUM: Magnesium: 1.8 mg/dL (ref 1.6–2.6)

## 2021-11-12 LAB — PREGNANCY, URINE: Pregnancy, Urine: NEGATIVE

## 2021-11-12 LAB — LIPASE: Lipase: 27 U/L (ref 13–60)

## 2021-11-12 MED ORDER — ALBUTEROL SULFATE HFA 108 (90 BASE) MCG/ACT IN AERS
108 (90 Base) MCG/ACT | Freq: Four times a day (QID) | RESPIRATORY_TRACT | 0 refills | Status: AC | PRN
Start: 2021-11-12 — End: ?

## 2021-11-12 MED ORDER — DEXAMETHASONE SOD PHOSPHATE PF 10 MG/ML IJ SOLN
10 MG/ML | Freq: Once | INTRAMUSCULAR | Status: AC
Start: 2021-11-12 — End: 2021-11-11
  Administered 2021-11-12: 03:00:00 10 mg via INTRAVENOUS

## 2021-11-12 MED ORDER — SODIUM CHLORIDE 0.9 % IV BOLUS
0.9 % | Freq: Once | INTRAVENOUS | Status: AC
Start: 2021-11-12 — End: 2021-11-11
  Administered 2021-11-12: 03:00:00 1000 mL via INTRAVENOUS

## 2021-11-12 MED ORDER — DIPHENHYDRAMINE HCL 50 MG/ML IJ SOLN
50 MG/ML | Freq: Once | INTRAMUSCULAR | Status: AC
Start: 2021-11-12 — End: 2021-11-11
  Administered 2021-11-12: 03:00:00 25 mg via INTRAVENOUS

## 2021-11-12 MED ORDER — KETOROLAC TROMETHAMINE 30 MG/ML IJ SOLN
30 MG/ML | Freq: Once | INTRAMUSCULAR | Status: AC
Start: 2021-11-12 — End: 2021-11-11
  Administered 2021-11-12: 04:00:00 30 mg via INTRAVENOUS

## 2021-11-12 MED ORDER — PHENYLEPHRINE HCL 0.5 % NA SOLN
0.5 % | Freq: Once | NASAL | Status: AC
Start: 2021-11-12 — End: 2021-11-11
  Administered 2021-11-12: 03:00:00 2 via NASAL

## 2021-11-12 MED ORDER — FLUTICASONE PROPIONATE 50 MCG/ACT NA SUSP
50 MCG/ACT | Freq: Every day | NASAL | 0 refills | Status: AC
Start: 2021-11-12 — End: ?

## 2021-11-12 MED ORDER — CETIRIZINE HCL 10 MG PO TABS
10 MG | ORAL_TABLET | Freq: Every day | ORAL | 0 refills | Status: AC
Start: 2021-11-12 — End: ?

## 2021-11-12 MED ORDER — POTASSIUM CHLORIDE CRYS ER 20 MEQ PO TBCR
20 MEQ | Freq: Once | ORAL | Status: AC
Start: 2021-11-12 — End: 2021-11-11
  Administered 2021-11-12: 04:00:00 40 meq via ORAL

## 2021-11-12 MED ORDER — ACETAMINOPHEN 500 MG PO TABS
500 MG | Freq: Once | ORAL | Status: AC
Start: 2021-11-12 — End: 2021-11-11
  Administered 2021-11-12: 04:00:00 1000 mg via ORAL

## 2021-11-12 MED ORDER — PROCHLORPERAZINE EDISYLATE 10 MG/2ML IJ SOLN
10 MG/2ML | Freq: Once | INTRAMUSCULAR | Status: AC
Start: 2021-11-12 — End: 2021-11-11
  Administered 2021-11-12: 03:00:00 10 mg via INTRAVENOUS

## 2021-11-12 MED FILL — ACETAMINOPHEN EXTRA STRENGTH 500 MG PO TABS: 500 MG | ORAL | Qty: 2

## 2021-11-12 MED FILL — DIPHENHYDRAMINE HCL 50 MG/ML IJ SOLN: 50 mg/mL | INTRAMUSCULAR | Qty: 1

## 2021-11-12 MED FILL — KETOROLAC TROMETHAMINE 30 MG/ML IJ SOLN: 30 MG/ML | INTRAMUSCULAR | Qty: 1

## 2021-11-12 MED FILL — NEO-SYNEPHRINE COLD/ALLRGY REG 0.5 % NA SOLN: 0.5 % | NASAL | Qty: 0.15

## 2021-11-12 MED FILL — DEXAMETHASONE SOD PHOSPHATE PF 10 MG/ML IJ SOLN: 10 mg/mL | INTRAMUSCULAR | Qty: 1

## 2021-11-12 MED FILL — PROCHLORPERAZINE EDISYLATE 10 MG/2ML IJ SOLN: 10 MG/2ML | INTRAMUSCULAR | Qty: 2

## 2021-11-12 MED FILL — KLOR-CON M20 20 MEQ PO TBCR: 20 MEQ | ORAL | Qty: 2

## 2021-11-13 LAB — CULTURE, URINE: Culture: NO GROWTH

## 2022-03-06 ENCOUNTER — Other Ambulatory Visit: Payer: Self-pay

## 2022-03-06 ENCOUNTER — Encounter: Payer: Self-pay | Admitting: Allergy

## 2022-03-06 ENCOUNTER — Ambulatory Visit (INDEPENDENT_AMBULATORY_CARE_PROVIDER_SITE_OTHER): Payer: Medicaid Other | Admitting: Allergy

## 2022-03-06 VITALS — BP 120/82 | HR 75 | Temp 97.8°F | Resp 14 | Ht 68.0 in | Wt 188.4 lb

## 2022-03-06 DIAGNOSIS — H1013 Acute atopic conjunctivitis, bilateral: Secondary | ICD-10-CM

## 2022-03-06 DIAGNOSIS — J339 Nasal polyp, unspecified: Secondary | ICD-10-CM

## 2022-03-06 DIAGNOSIS — H109 Unspecified conjunctivitis: Secondary | ICD-10-CM

## 2022-03-06 DIAGNOSIS — J310 Chronic rhinitis: Secondary | ICD-10-CM | POA: Diagnosis not present

## 2022-03-06 DIAGNOSIS — J4540 Moderate persistent asthma, uncomplicated: Secondary | ICD-10-CM

## 2022-03-06 MED ORDER — MONTELUKAST SODIUM 10 MG PO TABS: 10.0000 mg | ORAL_TABLET | Freq: Every day | ORAL | 5 refills | Status: DC

## 2022-03-06 MED ORDER — OLOPATADINE HCL 0.2 % OP SOLN: 1.0000 [drp] | OPHTHALMIC | 5 refills | Status: DC

## 2022-03-06 MED ORDER — FLUTICASONE PROPIONATE HFA 110 MCG/ACT IN AERO: 2.0000 | INHALATION_SPRAY | Freq: Two times a day (BID) | RESPIRATORY_TRACT | 5 refills | Status: DC

## 2022-03-06 MED ORDER — PREDNISONE 10 MG PO TABS: ORAL_TABLET | ORAL | 0 refills | Status: AC

## 2022-03-06 MED ORDER — AZELASTINE HCL 0.1 % NA SOLN: NASAL | 5 refills | Status: DC

## 2022-03-06 MED ORDER — XHANCE 93 MCG/ACT NA EXHU: INHALANT_SUSPENSION | NASAL | 5 refills | Status: DC

## 2022-03-06 NOTE — Progress Notes (Signed)
New Patient Note  RE: Stacey Wyatt MRN: [id number] DOB: [birth year] Date of Office Visit: 03/06/2022  Primary care provider: Laruth Bouchard, MD  Chief Complaint: nasal polyps, cant smell/taste  History of present illness: Stacey Wyatt is a 26 y.o. female presenting today for evaluation of nasal polyposis and allergies.   She has a history of nasal polyps and has had nasal polyp surgery at [facility] in 2016.  She states she had several years afterwards where she could breathe through her nose and was doing well.  She states back then her nasal polyps were so bad that it was "eroding my skull".   It does appear from records provided that she has skull base involvement.   She also has a history of allergic fungal sinusitis that was followed by ENT per records. She has an upcoming appointment with [facility] in the next couple months to re-evaluate her nasal congestion.   She states her symptoms are worsening.  She states she is congested all the time.  She is miserable.  She states her ears get clogged.  She cannot smell or taste.  She gets headaches.  She also has nasal drainage that is sometimes bloody.    She does report year-round sneezing and itchy/watery eyes.  She also reports have sore throat a lot.  She does drink a lot of hot teas/honey/ginger that does help the sore throat.   She states she did have allergy testing when she was a child that reports was positive to a lot of allergens and was told she should "live in a bubble".    She has a history of asthma.  She reports triggers include smoke exposure and when the weather is too hot.  Symptoms include wheezing, chest tightness and shortness of breath.  She states she has not had an albuterol inhaler in several years.  However her grandmother has albuterol inhalers and has used her inhaler up to 10 times a week at least.  Does not have any maintenance medications.  She has been using spoonful honey that she states help with  respiratory symptoms.  She does states singulair sounds familiar and may have used some time in the past.    She will take advil for headache control and does make her sleepy.  Does not report it worsens allergy or asthma symptoms.     May have had history of eczema as a child but no longer an issue.    Per records she has been prescribed the following medications from her PCP including azelastine nasal spray, Xyzal, Flonase. It appears that she has previously been prescribed Flonase, montelukast, albuterol inhaler and Qvar.  She does not have any of these medications at this time.   Review of systems: Review of Systems  Constitutional: Negative.   HENT:         See HPI  Eyes:        See HPI  Respiratory:         See HPI  Cardiovascular: Negative.   Gastrointestinal: Negative.   Musculoskeletal: Negative.   Skin: Negative.   Allergic/Immunologic: Negative.   Neurological:        See HPI   All other systems negative unless noted above in HPI  Past medical history: Past Medical History:  Diagnosis Date   Asthma    Nasal polyposis    Seasonal allergies    Syncope     Past surgical history: Past Surgical History:  Procedure Laterality Date  NASAL POLYP SURGERY Bilateral 2016    Family history:  Family History  Problem Relation Age of Onset   Hypertension Mother    Allergic rhinitis Father     Social history: Lives in apartment without carpeting with electric heating and central cooling.  No pets in the home.  No concern for water damage, mildew or roaches in the home.  She is a Occupational psychologist.  She does not report a smoking history.   Medication List: Current Outpatient Medications  Medication Sig Dispense Refill   CRANBERRY PO Take 3 capsules by mouth daily.     levocetirizine (XYZAL) 5 MG tablet Take 5 mg by mouth daily.     metroNIDAZOLE (FLAGYL) 500 MG tablet Take 1 tablet (500 mg total) by mouth 2 (two) times daily. 14 tablet 0    metroNIDAZOLE (METROGEL) 0.75 % vaginal gel Place 1 Applicatorful vaginally at bedtime. Apply one applicatorful to vagina at bedtime for 10 days, then twice a week for 6 months. 70 g 5   mupirocin nasal ointment (BACTROBAN) 2 % Apply in each nostril daily for 14 days 1 g 0   Norethindrone Acetate-Ethinyl Estrad-FE (LOESTRIN 24 FE) 1-20 MG-MCG(24) tablet Take 1 tablet by mouth daily. 30 tablet 11   Probiotic Product (PROBIOTIC PO) Take 1 capsule by mouth daily.     VENTOLIN HFA 108 (90 Base) MCG/ACT inhaler Inhale into the lungs.     No current facility-administered medications for this visit.    Known medication allergies: No Known Allergies   Physical examination: Blood pressure 120/82, pulse 75, temperature 97.8 F (36.6 C), resp. rate 14, height 5\' 8"  (1.727 m), weight 188 lb 6.4 oz (85.5 kg), SpO2 99 %, unknown if currently breastfeeding.  General: Alert, interactive, in no acute distress. HEENT: PERRLA, TMs pearly gray, complete nasal obstruction with visible shiny polypoid mucosal tissue past the inferior turbinate; with clear discharge, post-pharynx non erythematous. Neck: Supple without lymphadenopathy. Lungs: Clear to auscultation without wheezing, rhonchi or rales. {no increased work of breathing. CV: Normal S1, S2 without murmurs. Abdomen: Nondistended, nontender. Skin: Warm and dry, without lesions or rashes. Extremities:  No clubbing, cyanosis or edema. Neuro:   Grossly intact.  Diagnositics/Labs:  Spirometry: FEV1: 2.86L 90%, FVC: 3.1L 83%, ratio consistent with nonobstructive pattern  Allergy testing: deferred today  Assessment and plan: Nasal polyps - severe at this time with visible polyps on exam with no sense of taste or smell - previous nasal polyp removal with history of bony involvement - will obtain CT sinus at this time to assess degree of nasal polyps - agree with seeing ENT again as may need repeat polyp removal - for symptomatic control at this time  take Prednisone 30mg  twice a day x 2 then 20mg  twice a day x 2 days, then 10mg  twice a day x 2 days, then 10mg  daily x 1 day and stop - start Xhance nasal spray 2 sprays each nostril twice a day.  is a fluticasone (same steroid as flonase but stronger dose) that is approved for nasal polyp control.   - discussed biologic therapies for nasal polyps including Dupixent (oldest agent), Nucala and Xolair.  Would proceed with Dupixent injections at this time.  Informational brochure provided.  Once CT results come back we should be able to proceed with Dupixent approval  Rhinoconjunctivitis - will obtain environmental allergen panel - take Xyzal 5mg  daily.  This is an antihistamine for allergy symptom control - Xhance as above for nasal congestion -  for nasal drainage can use Astelin 2 sprays each nostril 1-2 times a day as needed - for itchy/watery eyes can use Pataday 1 drop each eye daily as needed  Asthma, mod persistent asthma - start Flovent 110mcg 2 puffs twice a day with spacer device - start Singulair 10mg  daily at bedtime.  This is an allergy and asthma medication.  If you notice any change in mood/behavior/sleep after starting Singulair then stop this medication and let us know.  Symptoms resolve after stopping the medication.   -have access to albuterol inhaler 2 puffs every 4-6 hours as needed for cough/wheeze/shortness of breath/chest tightness.  May use 15-20 minutes prior to activity.   Monitor frequency of use.   - obtaining CBC to assess for eosinophils  Asthma control goals:  Full participation in all desired activities (may need albuterol before activity) Albuterol use two time or less a week on average (not counting use with activity) Cough interfering with sleep two time or less a month Oral steroids no more than once a year No hospitalizations  Follow-up in 2-3 months or sooner if needed  I appreciate the opportunity to take part in Nakota's care. Please do not  hesitate to contact me with questions.  Sincerely,   Margo AyeShaylar Autymn Omlor, MD Allergy/Immunology [facility]

## 2022-03-06 NOTE — Patient Instructions (Addendum)
Nasal polyps - severe at this time with visible polyps on exam with no sense of taste or smell - previous nasal polyp removal with history of bony involvement - will obtain CT sinus at this time to assess degree of nasal polyps - agree with seeing ENT again as may need repeat polyp removal - for symptomatic control at this time take Prednisone 30mg  twice a day x 2 then 20mg  twice a day x 2 days, then 10mg  twice a day x 2 days, then 10mg  daily x 1 day and stop - start Xhance nasal spray 2 sprays each nostril twice a day.  [name] is a fluticasone (same steroid as flonase but stronger dose) that is approved for nasal polyp control.   - discussed biologic therapies for nasal polyps including Dupixent (oldest agent), Nucala and Xolair.  Would proceed with Dupixent injections at this time.  Informational brochure provided.  Once CT results come back we should be able to proceed with Dupixent approval  Rhinoconjunctivitis - will obtain environmental allergen panel - take Xyzal 5mg  daily.  This is an antihistamine for allergy symptom control - Xhance as above for nasal congestion - for nasal drainage can use Astelin 2 sprays each nostril 1-2 times a day as needed - for itchy/watery eyes can use Pataday 1 drop each eye daily as needed  Asthma, mod persistent asthma - start Flovent 17mcg 2 puffs twice a day with spacer device - start Singulair 10mg  daily at bedtime.  This is an allergy and asthma medication.  If you notice any change in mood/behavior/sleep after starting Singulair then stop this medication and let us know.  Symptoms resolve after stopping the medication.   -have access to albuterol inhaler 2 puffs every 4-6 hours as needed for cough/wheeze/shortness of breath/chest tightness.  May use 15-20 minutes prior to activity.   Monitor frequency of use.   - obtaining CBC to assess for eosinophils  Asthma control goals:  Full participation in all desired activities (may need albuterol before  activity) Albuterol use two time or less a week on average (not counting use with activity) Cough interfering with sleep two time or less a month Oral steroids no more than once a year No hospitalizations  Follow-up in 2-3 months or sooner if needed

## 2022-03-09 LAB — CBC WITH DIFFERENTIAL
Basophils Absolute: 0 10*3/uL (ref 0.0–0.2)
Basos: 1 %
EOS (ABSOLUTE): 0.6 10*3/uL — ABNORMAL HIGH (ref 0.0–0.4)
Eos: 14 %
Hematocrit: 40.2 % (ref 34.0–46.6)
Hemoglobin: 13.6 g/dL (ref 11.1–15.9)
Immature Grans (Abs): 0 10*3/uL (ref 0.0–0.1)
Immature Granulocytes: 0 %
Lymphocytes Absolute: 1.9 10*3/uL (ref 0.7–3.1)
Lymphs: 49 %
MCH: 29.8 pg (ref 26.6–33.0)
MCHC: 33.8 g/dL (ref 31.5–35.7)
MCV: 88 fL (ref 79–97)
Monocytes Absolute: 0.3 10*3/uL (ref 0.1–0.9)
Monocytes: 6 %
Neutrophils Absolute: 1.2 10*3/uL — ABNORMAL LOW (ref 1.4–7.0)
Neutrophils: 30 %
RBC: 4.56 x10E6/uL (ref 3.77–5.28)
RDW: 12.2 % (ref 11.7–15.4)
WBC: 3.9 10*3/uL (ref 3.4–10.8)

## 2022-03-09 LAB — ALLERGENS W/TOTAL IGE AREA 2
Alternaria Alternata IgE: 3.11 kU/L — AB
Aspergillus Fumigatus IgE: 1.58 kU/L — AB
Bermuda Grass IgE: 1.05 kU/L — AB
Cat Dander IgE: 4.58 kU/L — AB
Cedar, Mountain IgE: 0.27 kU/L — AB
Cladosporium Herbarum IgE: 0.64 kU/L — AB
Cockroach, German IgE: 0.12 kU/L — AB
Common Silver Birch IgE: 0.32 kU/L — AB
Cottonwood IgE: 0.26 kU/L — AB
D Farinae IgE: 0.18 kU/L — AB
D Pteronyssinus IgE: 0.22 kU/L — AB
Dog Dander IgE: 6.61 kU/L — AB
Elm, American IgE: 0.82 kU/L — AB
IgE (Immunoglobulin E), Serum: 761 IU/mL — ABNORMAL HIGH (ref 6–495)
Johnson Grass IgE: 0.85 kU/L — AB
Maple/Box Elder IgE: 3.75 kU/L — AB
Mouse Urine IgE: <0.1 kU/L
Oak, White IgE: 0.38 kU/L — AB
Pecan, Hickory IgE: 0.73 kU/L — AB
Penicillium Chrysogen IgE: 1.67 kU/L — AB
Pigweed, Rough IgE: 0.25 kU/L — AB
Ragweed, Short IgE: 22.4 kU/L — AB
Sheep Sorrel IgE Qn: 0.71 kU/L — AB
Timothy Grass IgE: 11.2 kU/L — AB
White Mulberry IgE: 0.11 kU/L — AB

## 2022-03-11 ENCOUNTER — Telehealth: Payer: Self-pay

## 2022-03-11 NOTE — Telephone Encounter (Signed)
Pa submitted thru cover my meds for xhance waiting on rsponse from insurance [place] [id number]

## 2022-03-13 ENCOUNTER — Telehealth: Payer: Self-pay | Admitting: Allergy

## 2022-03-13 NOTE — Telephone Encounter (Signed)
Called [facility] Provider line/[phone] - per Theodis Aguas., CSR   Approval for prior authorization for:  CT Maxillofacial w/o contrast CPT: 70486 Case Ref# [id number] Approval dates: 03/13/2022 - 04/27/2022 Approval authorization: [id number]  Patient's CT is scheduled 03/17/22 @ 1 pm @  [facility]  [address] [place] [address] [phone]  I tried calling [facility] to advise of approval but they were already closed - Scheduling dept hours: 8 am - 5 pm M-F - will try to call Monday morning.

## 2022-03-13 NOTE — Telephone Encounter (Signed)
Diagnostic radiology and imaging request a PA for the CT that was ordered, they need it by noon on Monday or they appt will be cancelled.

## 2022-03-17 ENCOUNTER — Ambulatory Visit
Admission: RE | Admit: 2022-03-17 | Discharge: 2022-03-17 | Disposition: A | Discharge status: Home / Self Care | Payer: PRIVATE HEALTH INSURANCE | Source: Ambulatory Visit | Attending: Allergy | Admitting: Allergy

## 2022-03-18 ENCOUNTER — Encounter: Payer: Self-pay | Admitting: Allergy

## 2022-04-20 ENCOUNTER — Encounter (HOSPITAL_COMMUNITY): Payer: Self-pay

## 2022-04-20 ENCOUNTER — Emergency Department (HOSPITAL_COMMUNITY)
Admission: EM | Admit: 2022-04-20 | Discharge: 2022-04-20 | Disposition: A | Discharge status: Home / Self Care | Payer: Medicaid Other | Attending: Emergency Medicine | Admitting: Emergency Medicine

## 2022-04-20 DIAGNOSIS — H66011 Acute suppurative otitis media with spontaneous rupture of ear drum, right ear: Secondary | ICD-10-CM | POA: Insufficient documentation

## 2022-04-20 DIAGNOSIS — H9201 Otalgia, right ear: Secondary | ICD-10-CM | POA: Diagnosis present

## 2022-04-20 MED ORDER — AMOXICILLIN 500 MG PO CAPS: 500.0000 mg | ORAL_CAPSULE | Freq: Three times a day (TID) | ORAL | 0 refills | Status: DC

## 2022-04-20 MED ORDER — NAPROXEN 500 MG PO TABS: 500.0000 mg | ORAL_TABLET | Freq: Two times a day (BID) | ORAL | 0 refills | Status: DC

## 2022-04-20 NOTE — Discharge Instructions (Addendum)
You have an ear infection that appears to have ruptured the tympanic membrane.  Take the antibiotics as prescribed.  Follow-up with an ENT doctor to be rechecked

## 2022-04-20 NOTE — ED Provider Notes (Signed)
[facility]-EMERGENCY DEPT Provider Note   CSN: [id number] Arrival date & time: 04/20/22  1041     History  Chief Complaint  Patient presents with   Ear Drainage   Otalgia    right    Stacey Wyatt is a 26 y.o. female.   Ear Drainage  Otalgia   Patient presents to the ED with complaints of an earache.  Patient states she started having ear pain on Saturday.  She also had a sore throat at that time.  Patient feels like the sore throat is somewhat better but the ear is very painful.  She started to notice drainage from her ear this morning.  No fevers.  No difficulty swallowing.  Home Medications Prior to Admission medications   Medication Sig Start Date End Date Taking? Authorizing Provider  amoxicillin (AMOXIL) 500 MG capsule Take 1 capsule (500 mg total) by mouth 3 (three) times daily. 04/20/22  Yes Linwood Dibbles, MD  naproxen (NAPROSYN) 500 MG tablet Take 1 tablet (500 mg total) by mouth 2 (two) times daily with a meal. As needed for pain 04/20/22  Yes Linwood Dibbles, MD  azelastine (ASTELIN) 0.1 % nasal spray Apply 2 sprays each nostril 1-2 times a day as needed 03/06/22   Marcelyn Bruins, MD  fluticasone (FLOVENT HFA) 110 MCG/ACT inhaler Inhale 2 puffs into the lungs 2 (two) times daily. 03/06/22   Marcelyn Bruins, MD  Fluticasone Propionate Timmothy Sours) 93 MCG/ACT EXHU Apply 2 sprays each nostril twice a day 03/06/22   Marcelyn Bruins, MD  levocetirizine (XYZAL) 5 MG tablet Take 5 mg by mouth daily. 01/30/22   [provider]  montelukast (SINGULAIR) 10 MG tablet Take 1 tablet (10 mg total) by mouth at bedtime. 03/06/22   Marcelyn Bruins, MD  Olopatadine HCl (PATADAY) 0.2 % SOLN Place 1 drop into both eyes 1 day or 1 dose. 03/06/22   Marcelyn Bruins, MD  VENTOLIN HFA 108 [phone] Base) MCG/ACT inhaler Inhale into the lungs. 01/30/22   [provider]      Allergies    Patient has no known allergies.     Review of Systems   Review of Systems  HENT:  Positive for ear pain.     Physical Exam Updated Vital Signs BP 115/80   Pulse (!) 106   Temp 99.8 F (37.7 C) (Oral)   Resp 18   LMP 04/19/2022 (Exact Date)   SpO2 99%  Physical Exam Vitals and nursing note reviewed.  Constitutional:      General: She is not in acute distress.    Appearance: She is well-developed.  HENT:     Head: Normocephalic and atraumatic.     Left Ear: External ear normal.     Ears:     Comments: Purulent drainage noted from the right ear.  No tenderness to palpation of the external auditory canal.  Unable to completely evaluate the TM because of the mucopurulent drainage    Mouth/Throat:     Mouth: Mucous membranes are moist.     Pharynx: No oropharyngeal exudate or posterior oropharyngeal erythema.     Tonsils: No tonsillar exudate or tonsillar abscesses.  Eyes:     General: No scleral icterus.       Right eye: No discharge.        Left eye: No discharge.     Conjunctiva/sclera: Conjunctivae normal.  Neck:     Trachea: No tracheal deviation.  Cardiovascular:     Rate  and Rhythm: Normal rate.  Pulmonary:     Effort: Pulmonary effort is normal. No respiratory distress.     Breath sounds: No stridor.  Abdominal:     General: There is no distension.  Musculoskeletal:        General: No swelling or deformity.     Cervical back: Neck supple.  Skin:    General: Skin is warm and dry.     Findings: No rash.  Neurological:     Mental Status: She is alert.     Cranial Nerves: Cranial nerve deficit: no gross deficits.     ED Results / Procedures / Treatments   Labs (all labs ordered are listed, but only abnormal results are displayed) Labs Reviewed - No data to display  EKG None  Radiology No results found.  Procedures Procedures    Medications Ordered in ED Medications - No data to display  ED Course/ Medical Decision Making/ A&P                           Medical Decision  Making Problems Addressed: Acute suppurative otitis media of right ear with spontaneous rupture of tympanic membrane, recurrence not specified: acute illness or injury that poses a threat to life or bodily functions  Risk Prescription drug management.   Patient appears to have ruptured TM due to otitis media.  Will start on a course of antibiotics.  Outpatient follow-up with ENT        Final Clinical Impression(s) / ED Diagnoses Final diagnoses:  Acute suppurative otitis media of right ear with spontaneous rupture of tympanic membrane, recurrence not specified    Rx / DC Orders ED Discharge Orders          Ordered    amoxicillin (AMOXIL) 500 MG capsule  3 times daily        04/20/22 1120    naproxen (NAPROSYN) 500 MG tablet  2 times daily with meals        04/20/22 1120              Linwood Dibbles, MD 04/20/22 1126

## 2022-04-20 NOTE — ED Triage Notes (Addendum)
Pt reports sore throat that started last Tuesday. On Saturday reports she woke up feeling very fatigued.   C/o right ear pain that started Saturday morning. Reports right ear is leaking mucous.   10/10 right ear pain   Hx: ear infections  Reports appointment with ENT next week for nasal polyps.  A/ox4 Ambulatory in triage

## 2022-04-29 ENCOUNTER — Ambulatory Visit (INDEPENDENT_AMBULATORY_CARE_PROVIDER_SITE_OTHER): Payer: Medicaid Other | Admitting: Family Medicine

## 2022-04-29 ENCOUNTER — Encounter: Payer: Self-pay | Admitting: Family Medicine

## 2022-04-29 ENCOUNTER — Other Ambulatory Visit (HOSPITAL_COMMUNITY)
Admission: RE | Admit: 2022-04-29 | Discharge: 2022-04-29 | Disposition: A | Discharge status: Home / Self Care | Payer: Medicaid Other | Source: Ambulatory Visit | Attending: Family Medicine | Admitting: Family Medicine

## 2022-04-29 VITALS — BP 120/87 | HR 85 | Ht 68.0 in | Wt 186.0 lb

## 2022-04-29 DIAGNOSIS — Z23 Encounter for immunization: Secondary | ICD-10-CM

## 2022-04-29 DIAGNOSIS — Z7721 Contact with and (suspected) exposure to potentially hazardous body fluids: Secondary | ICD-10-CM | POA: Insufficient documentation

## 2022-04-29 DIAGNOSIS — Z01419 Encounter for gynecological examination (general) (routine) without abnormal findings: Secondary | ICD-10-CM

## 2022-04-29 NOTE — Progress Notes (Signed)
GYNECOLOGY ANNUAL PREVENTATIVE CARE ENCOUNTER NOTE  Subjective:   Stacey Wyatt is a 26 y.o. G35P1021 female here for a routine annual gynecologic exam.  Current complaints: none.   Denies abnormal vaginal bleeding, discharge, pelvic pain, problems with intercourse or other gynecologic concerns.    Gynecologic History Patient's last menstrual period was 04/19/2022 (exact date). Patient is sexually active  Contraception: condoms Last Pap: 2021. Results were: normal Last mammogram: n/a.  The pregnancy intention screening data noted above was reviewed. Potential methods of contraception were discussed. The patient elected to proceed with No data recorded.   Obstetric History OB History  Gravida Para Term Preterm AB Living  3 1 1   2 1   SAB IAB Ectopic Multiple Live Births  1 1   0 1    # Outcome Date GA Lbr Len/2nd Weight Sex Delivery Anes PTL Lv  3 IAB 04/27/20          2 Term 11/02/18 [redacted]w[redacted]d 07:39 / 00:18 7 lb 6.3 oz (3.355 kg) F Vag-Spont EPI, Local  LIV  1 SAB 12/21/16 [redacted]w[redacted]d           Past Medical History:  Diagnosis Date   Asthma    Nasal polyposis    Seasonal allergies    Syncope     Past Surgical History:  Procedure Laterality Date   NASAL POLYP SURGERY Bilateral 2016    No current outpatient medications on file prior to visit.   No current facility-administered medications on file prior to visit.    No Known Allergies  Social History   Socioeconomic History   Marital status: Single    Spouse name: Not on file   Number of children: Not on file   Years of education: Not on file   Highest education level: Not on file  Occupational History   Not on file  Tobacco Use   Smoking status: Never   Smokeless tobacco: Never  Vaping Use   Vaping Use: Never used  Substance and Sexual Activity   Alcohol use: No    Alcohol/week: 0.0 standard drinks of alcohol   Drug use: No   Sexual activity: Not Currently    Birth control/protection: None  Other Topics  Concern   Not on file  Social History Narrative   Not on file   Social Determinants of Health   Financial Resource Strain: Not on file  Food Insecurity: Not on file  Transportation Needs: Not on file  Physical Activity: Not on file  Stress: Not on file  Social Connections: Not on file  Intimate Partner Violence: Not on file    Family History  Problem Relation Age of Onset   Hypertension Mother    Allergic rhinitis Father     The following portions of the patient's history were reviewed and updated as appropriate: allergies, current medications, past family history, past medical history, past social history, past surgical history and problem list.  Review of Systems Pertinent items are noted in HPI.   Objective:  BP 120/87   Pulse 85   Ht 5\' 8"  (1.727 m)   Wt 186 lb (84.4 kg)   LMP 04/19/2022 (Exact Date)   BMI 28.28 kg/m  Wt Readings from Last 3 Encounters:  04/29/22 186 lb (84.4 kg)  03/06/22 188 lb 6.4 oz (85.5 kg)  07/04/20 184 lb (83.5 kg)     Chaperone present during exam  CONSTITUTIONAL: Well-developed, well-nourished female in no acute distress.  HENT:  Normocephalic, atraumatic, External right and  left ear normal. Oropharynx is clear and moist EYES: Conjunctivae and EOM are normal. Pupils are equal, round, and reactive to light. No scleral icterus.  NECK: Normal range of motion, supple, no masses.  Normal thyroid.   CARDIOVASCULAR: Normal heart rate noted, regular rhythm RESPIRATORY: Clear to auscultation bilaterally. Effort and breath sounds normal, no problems with respiration noted. BREASTS: Symmetric in size. No masses, skin changes, nipple drainage, or lymphadenopathy. ABDOMEN: Soft, normal bowel sounds, no distention noted.  No tenderness, rebound or guarding.  PELVIC: Normal appearing external genitalia; normal appearing vaginal mucosa and cervix.  No abnormal discharge noted.  Normal uterine size, no other palpable masses, no uterine or adnexal  tenderness. MUSCULOSKELETAL: Normal range of motion. No tenderness.  No cyanosis, clubbing, or edema.  2+ distal pulses. SKIN: Skin is warm and dry. No rash noted. Not diaphoretic. No erythema. No pallor. NEUROLOGIC: Alert and oriented to person, place, and time. Normal reflexes, muscle tone coordination. No cranial nerve deficit noted. PSYCHIATRIC: Normal mood and affect. Normal behavior. Normal judgment and thought content.  Assessment:  Annual gynecologic examination with pap smear   Plan:  1. Well Woman Exam PAP not due today. Due next year. STD testing discussed. Patient requested testing   2. Exposure to body fluid - Cervicovaginal ancillary only( [place])  3. Need for diphtheria-tetanus-pertussis (Tdap) vaccine - Tdap vaccine greater than or equal to 7yo IM  4. Need for HPV vaccination - HPV 9-valent vaccine,Recombinat   Routine preventative health maintenance measures emphasized. Please refer to After Visit Summary for other counseling recommendations.    Candelaria Celeste, DO [facility]

## 2022-04-30 LAB — CERVICOVAGINAL ANCILLARY ONLY
Chlamydia: NEGATIVE
Comment: NEGATIVE
Comment: NEGATIVE
Comment: NORMAL
Neisseria Gonorrhea: NEGATIVE
Trichomonas: NEGATIVE

## 2022-06-14 ENCOUNTER — Encounter: Payer: Self-pay | Admitting: Family Medicine

## 2022-06-15 ENCOUNTER — Other Ambulatory Visit: Payer: Self-pay

## 2022-06-15 MED ORDER — METRONIDAZOLE 500 MG PO TABS: 500.0000 mg | ORAL_TABLET | Freq: Two times a day (BID) | ORAL | 0 refills | Status: DC

## 2022-06-29 DIAGNOSIS — J324 Chronic pansinusitis: Secondary | ICD-10-CM | POA: Diagnosis not present

## 2022-06-29 DIAGNOSIS — J3089 Other allergic rhinitis: Secondary | ICD-10-CM | POA: Diagnosis not present

## 2022-06-29 DIAGNOSIS — J343 Hypertrophy of nasal turbinates: Secondary | ICD-10-CM | POA: Diagnosis not present

## 2022-06-29 DIAGNOSIS — J339 Nasal polyp, unspecified: Secondary | ICD-10-CM | POA: Diagnosis not present

## 2022-06-30 ENCOUNTER — Encounter: Payer: Self-pay | Admitting: Allergy

## 2022-07-02 ENCOUNTER — Ambulatory Visit: Payer: Medicaid Other

## 2022-07-08 ENCOUNTER — Ambulatory Visit: Payer: PRIVATE HEALTH INSURANCE | Admitting: Allergy

## 2022-07-08 ENCOUNTER — Ambulatory Visit: Payer: Medicaid Other

## 2022-07-20 ENCOUNTER — Encounter: Payer: Self-pay | Admitting: Family Medicine

## 2022-07-20 MED ORDER — ULIPRISTAL ACETATE 30 MG PO TABS: 1.0000 | ORAL_TABLET | Freq: Once | ORAL | 6 refills | Status: AC

## 2022-07-27 DIAGNOSIS — J329 Chronic sinusitis, unspecified: Secondary | ICD-10-CM | POA: Diagnosis not present

## 2022-07-27 DIAGNOSIS — J3489 Other specified disorders of nose and nasal sinuses: Secondary | ICD-10-CM | POA: Diagnosis not present

## 2022-07-27 DIAGNOSIS — J331 Polypoid sinus degeneration: Secondary | ICD-10-CM | POA: Diagnosis not present

## 2022-07-27 DIAGNOSIS — J339 Nasal polyp, unspecified: Secondary | ICD-10-CM | POA: Diagnosis not present

## 2022-07-27 DIAGNOSIS — J3089 Other allergic rhinitis: Secondary | ICD-10-CM | POA: Diagnosis not present

## 2022-07-27 DIAGNOSIS — J343 Hypertrophy of nasal turbinates: Secondary | ICD-10-CM | POA: Diagnosis not present

## 2022-07-27 DIAGNOSIS — Z48810 Encounter for surgical aftercare following surgery on the sense organs: Secondary | ICD-10-CM | POA: Diagnosis not present

## 2022-07-27 DIAGNOSIS — J330 Polyp of nasal cavity: Secondary | ICD-10-CM | POA: Diagnosis not present

## 2022-09-01 DIAGNOSIS — J3089 Other allergic rhinitis: Secondary | ICD-10-CM | POA: Diagnosis not present

## 2022-09-01 DIAGNOSIS — Z7951 Long term (current) use of inhaled steroids: Secondary | ICD-10-CM | POA: Diagnosis not present

## 2022-09-01 DIAGNOSIS — Z79899 Other long term (current) drug therapy: Secondary | ICD-10-CM | POA: Diagnosis not present

## 2022-09-01 DIAGNOSIS — J339 Nasal polyp, unspecified: Secondary | ICD-10-CM | POA: Diagnosis not present

## 2022-09-01 DIAGNOSIS — Z9889 Other specified postprocedural states: Secondary | ICD-10-CM | POA: Diagnosis not present

## 2022-09-01 DIAGNOSIS — Z48810 Encounter for surgical aftercare following surgery on the sense organs: Secondary | ICD-10-CM | POA: Diagnosis not present

## 2022-09-01 DIAGNOSIS — B49 Unspecified mycosis: Secondary | ICD-10-CM | POA: Diagnosis not present

## 2022-09-01 DIAGNOSIS — Z8709 Personal history of other diseases of the respiratory system: Secondary | ICD-10-CM | POA: Diagnosis not present

## 2022-09-01 DIAGNOSIS — R0981 Nasal congestion: Secondary | ICD-10-CM | POA: Diagnosis not present

## 2022-09-01 DIAGNOSIS — J324 Chronic pansinusitis: Secondary | ICD-10-CM | POA: Diagnosis not present

## 2022-10-07 IMAGING — CT CT MAXILLOFACIAL W/O CM
1 series · 14 of 30 positions shown, 18 images · non-contrast
Comparison: Brain MRI 05/21/2017 paranasal sinus CT 11/17/2014

CLINICAL DATA: Nasal polyps, lack of smell and taste. History of
surgery in 8267.

EXAM:
CT MAXILLOFACIAL WITHOUT CONTRAST
TECHNIQUE: Multidetector CT images of the paranasal sinuses were obtained using
the standard protocol without intravenous contrast.
RADIATION DOSE REDUCTION: This exam was performed according to the
departmental dose-optimization program which includes automated
exposure control, adjustment of the mA and/or kV according to
patient size and/or use of iterative reconstruction technique.

[Series 4: soft tissue · axial · 0.36mm/px · z∈[-211,-85]mm · 14 of 141 slices shown, 18 images]
[im 10/141  brain]
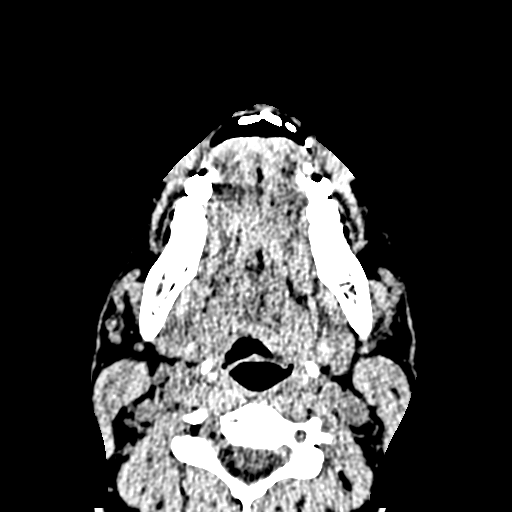
[im 10/141  bone]
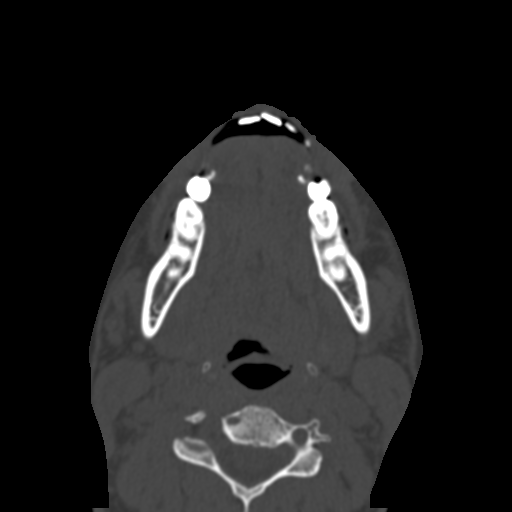
[im 20/141  bone]
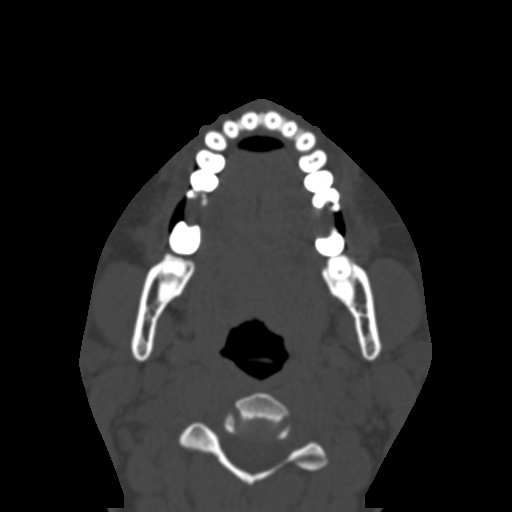
[im 29/141  bone]
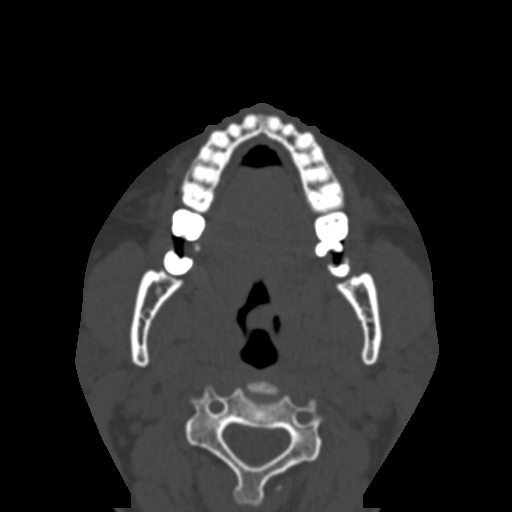
[im 39/141  bone]
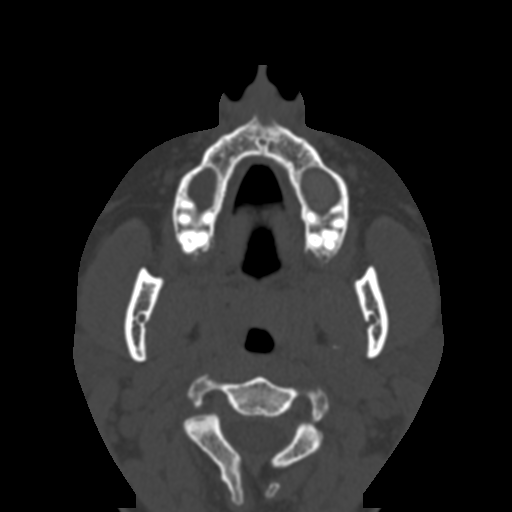
[im 49/141  brain]
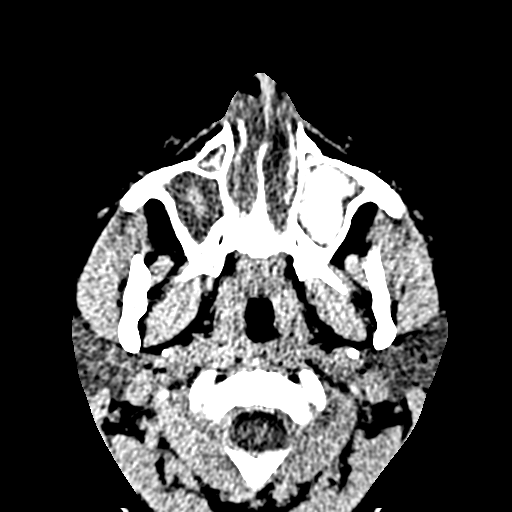
[im 49/141  bone]
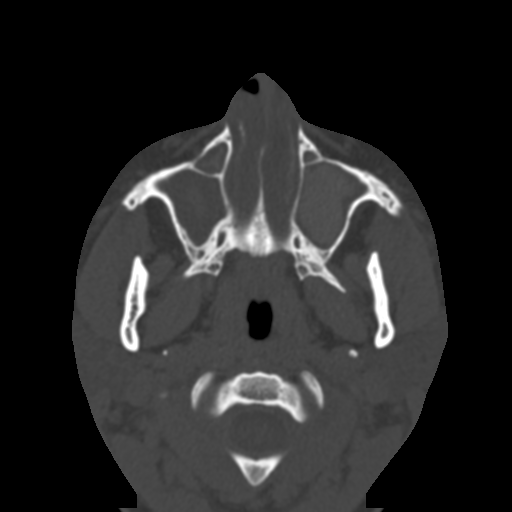
[im 58/141  bone]
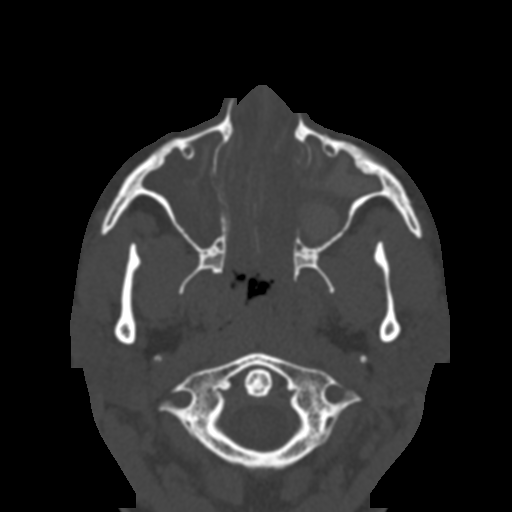
[im 68/141  bone]
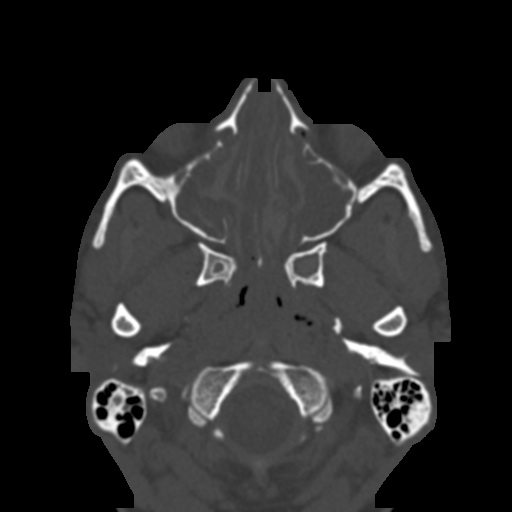
[im 78/141  bone]
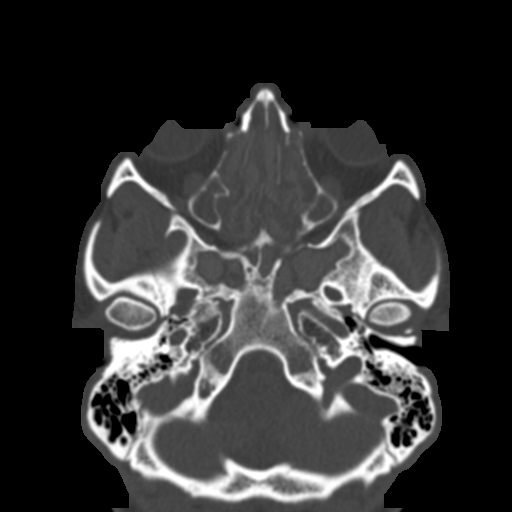
[im 87/141  brain]
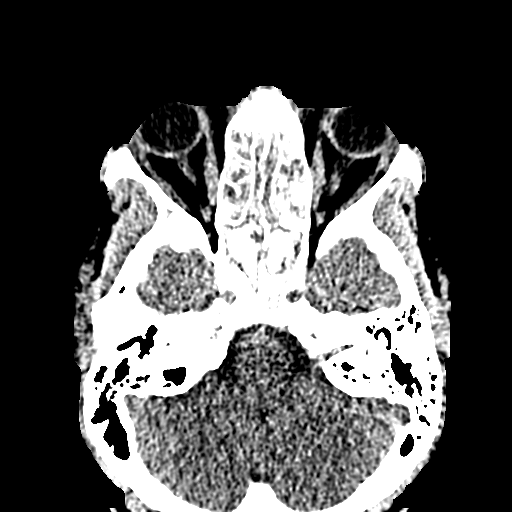
[im 87/141  bone]
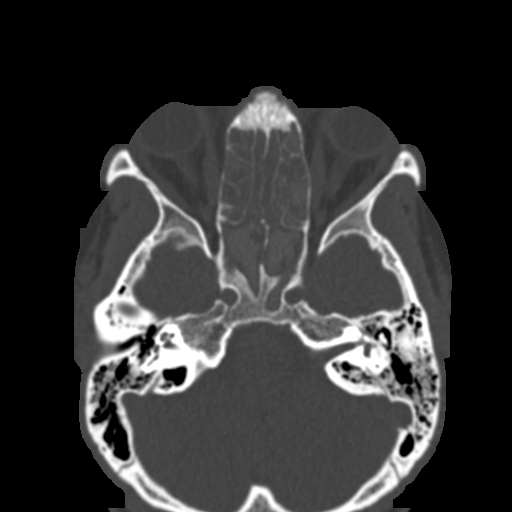
[im 97/141  bone]
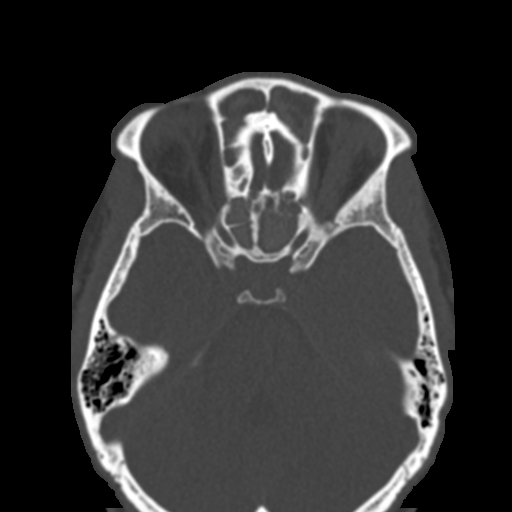
[im 107/141  bone]
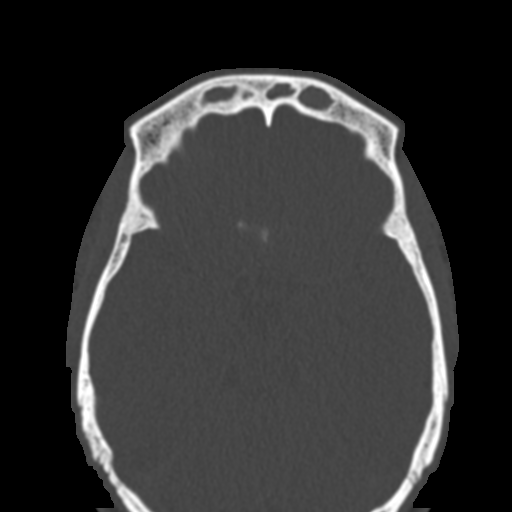
[im 116/141  bone]
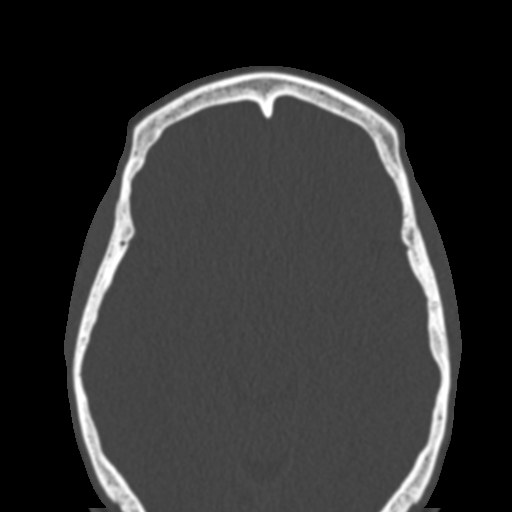
[im 126/141  brain]
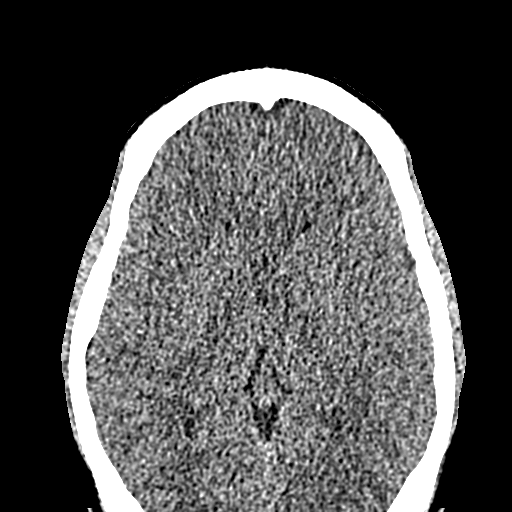
[im 126/141  bone]
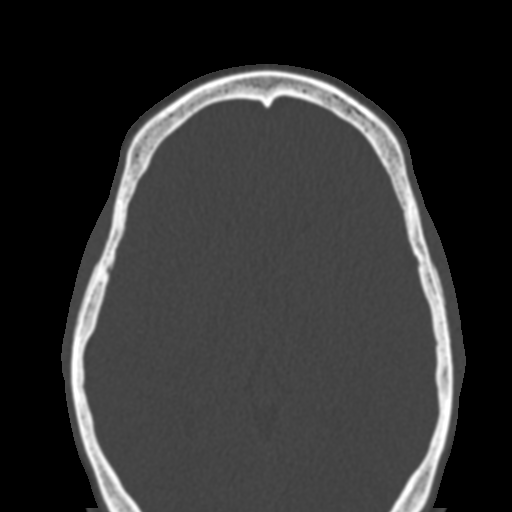
[im 136/141  bone]
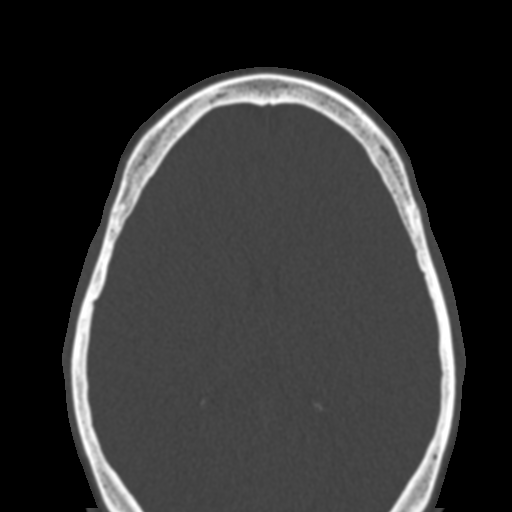

[14 of 30 positions shown; findings below may reference images not displayed]

FINDINGS: Paranasal sinuses:

Frontal: Completely opacified with areas of hyperdensity.

Ethmoid: Completely opacified with areas of hyperdensity. Previously
seen outward bowing of the lamina papyracea has decreased. There is
no definite dehiscence of the lamina papyracea on the current study.

Maxillary: There is complete opacification of the bilateral
maxillary sinuses with areas of hyperdensity. The infundibula appear
widened. There is hyperostosis of the surrounding maxillary sinus
walls consistent with chronic sinusitis.

Sphenoid: There is complete opacification of the bilateral sphenoid
sinuses with areas of hyperdensity. Previously seen upward bowing of
the sphenoid roof appears decreased, and previously seen dehiscence
of the superior and posterior sphenoid sinus walls is no longer
appreciated. There is hyperostosis of the surrounding sphenoid sinus
walls consistent with chronic sinusitis.

Right ostiomeatal unit: Opacified.

Left ostiomeatal unit: Opacified.

Nasal passages: The nasal cavity is completely opacified. No
discrete lesion is identified. The nasal septum appears midline.

Anatomy: No pneumatization superior to anterior ethmoid notches.
Symmetric and intact olfactory grooves and fovea ethmoidalis, Keros
II (4-7mm). Sellar sphenoid pneumatization pattern. No dehiscence of
carotid or optic canals. No onodi cell. The vidian nerves are
partially pedunculated within the sphenoid sinuses bilaterally
(5-47).

Other: The imaged intracranial compartment is grossly unremarkable.
The globes and orbits are unremarkable. There is no stranding in the
orbital fat or mass effect on the extra-ocular muscles. The mastoid
air cells are clear.
IMPRESSION: 1. Complete opacification of the paranasal sinuses and nasal cavity
with extensive hyperdense material suspicious for fungal
colonization/allergic fungal sinusitis and/or inspissated
secretions. Underlying polyposis is not excluded, though no discrete
lesions are seen on the current study.
2. Previously seen upward bowing of the sphenoid sinus roof is no
longer seen, and previously seen dehiscence of the sphenoid sinus
walls is no longer seen.
3. Similarly, previously seen outward bowing of the lamina papyracea
has decreased, with no definite dehiscence on the current study.

## 2022-10-16 DIAGNOSIS — Z113 Encounter for screening for infections with a predominantly sexual mode of transmission: Secondary | ICD-10-CM | POA: Diagnosis not present

## 2022-10-16 DIAGNOSIS — Z3202 Encounter for pregnancy test, result negative: Secondary | ICD-10-CM | POA: Diagnosis not present

## 2022-10-16 DIAGNOSIS — L739 Follicular disorder, unspecified: Secondary | ICD-10-CM | POA: Diagnosis not present

## 2022-10-16 DIAGNOSIS — N760 Acute vaginitis: Secondary | ICD-10-CM | POA: Diagnosis not present

## 2022-10-16 DIAGNOSIS — Z114 Encounter for screening for human immunodeficiency virus [HIV]: Secondary | ICD-10-CM | POA: Diagnosis not present

## 2022-10-29 ENCOUNTER — Ambulatory Visit: Payer: Medicaid Other

## 2022-11-11 ENCOUNTER — Ambulatory Visit: Payer: BC Managed Care – PPO

## 2022-12-18 ENCOUNTER — Ambulatory Visit: Payer: BC Managed Care – PPO | Admitting: Family

## 2022-12-18 ENCOUNTER — Ambulatory Visit (INDEPENDENT_AMBULATORY_CARE_PROVIDER_SITE_OTHER): Payer: BC Managed Care – PPO

## 2022-12-18 DIAGNOSIS — J339 Nasal polyp, unspecified: Secondary | ICD-10-CM | POA: Diagnosis not present

## 2022-12-18 MED ORDER — DUPILUMAB 300 MG/2ML ~~LOC~~ SOSY
300.0000 mg | PREFILLED_SYRINGE | Freq: Once | SUBCUTANEOUS | Status: AC
  Administered 2022-12-18: 300 mg via SUBCUTANEOUS

## 2022-12-21 ENCOUNTER — Other Ambulatory Visit: Payer: Self-pay

## 2022-12-21 ENCOUNTER — Ambulatory Visit (INDEPENDENT_AMBULATORY_CARE_PROVIDER_SITE_OTHER): Payer: BC Managed Care – PPO | Admitting: Family Medicine

## 2022-12-21 ENCOUNTER — Encounter: Payer: Self-pay | Admitting: Family Medicine

## 2022-12-21 VITALS — BP 120/90 | HR 77 | Temp 97.9°F | Resp 16 | Wt 213.8 lb

## 2022-12-21 DIAGNOSIS — J302 Other seasonal allergic rhinitis: Secondary | ICD-10-CM

## 2022-12-21 DIAGNOSIS — J3089 Other allergic rhinitis: Secondary | ICD-10-CM

## 2022-12-21 DIAGNOSIS — J4520 Mild intermittent asthma, uncomplicated: Secondary | ICD-10-CM | POA: Diagnosis not present

## 2022-12-21 DIAGNOSIS — L2084 Intrinsic (allergic) eczema: Secondary | ICD-10-CM | POA: Diagnosis not present

## 2022-12-21 DIAGNOSIS — J339 Nasal polyp, unspecified: Secondary | ICD-10-CM

## 2022-12-21 MED ORDER — PROAIR RESPICLICK 108 (90 BASE) MCG/ACT IN AEPB: 2.0000 | INHALATION_SPRAY | RESPIRATORY_TRACT | 1 refills | Status: AC | PRN

## 2022-12-21 MED ORDER — PULMICORT FLEXHALER 90 MCG/ACT IN AEPB: INHALATION_SPRAY | RESPIRATORY_TRACT | 1 refills | Status: AC

## 2022-12-21 NOTE — Patient Instructions (Addendum)
Asthma Continue albuterol 2 puffs once every 4 hours as needed for cough or wheeze You may use albuterol 2 puffs 5 to 15 minutes before activity to decrease cough or wheeze For asthma flare, begin Pulmicort 90-2 puffs twice a day for 2 weeks or until cough and wheeze free  Allergic rhinitis Continue allergen avoidance measures directed toward pollen, mold, dust mite, pets, and cockroach as listed below You may take an antihistamine once a day as needed for a runny nose. Remember to rotate to a different antihistamine about every 3 months. Some examples of over the counter antihistamines include Zyrtec (cetirizine), Xyzal (levocetirizine), Allegra (fexofenadine), and Claritin (loratidine).  Begin Flonase or Nasacort 2 sprays in each nostril once a day for a stuffy nose. In the right nostril, point the applicator out toward the right ear. In the left nostril, point the applicator out toward the left ear Consider saline nasal rinses as needed for nasal symptoms. Use this before any medicated nasal sprays for best result Consider allergen immunotherapy if your symptoms are not well controlled with the treatment plan as listed above  Atopic dermatitis/rash Continue a twice a day moisturizing routine If your symptoms re-occur, begin a journal of events that occurred for up to 6 hours before your symptoms began including foods and beverages consumed, soaps or perfumes you had contact with, and medications.   Nasal polyposis Continue Dupixent 300 mg injections once every 2 weeks Begin Flonase or Nasacort 2 sprays in each nostril once a day to keep nasal polyps controlled  Call the clinic if this treatment plan is not working well for you.  Follow up in 6 months or sooner if needed.  Reducing Pollen Exposure The American Academy of Allergy, Asthma and Immunology suggests the following steps to reduce your exposure to pollen during allergy seasons. Do not hang sheets or clothing out to dry; pollen  may collect on these items. Do not mow lawns or spend time around freshly cut grass; mowing stirs up pollen. Keep windows closed at night.  Keep car windows closed while driving. Minimize morning activities outdoors, a time when pollen counts are usually at their highest. Stay indoors as much as possible when pollen counts or humidity is high and on windy days when pollen tends to remain in the air longer. Use air conditioning when possible.  Many air conditioners have filters that trap the pollen spores. Use a HEPA room air filter to remove pollen form the indoor air you breathe.  Control of Mold Allergen Mold and fungi can grow on a variety of surfaces provided certain temperature and moisture conditions exist.  Outdoor molds grow on plants, decaying vegetation and soil.  The major outdoor mold, Alternaria and Cladosporium, are found in very high numbers during hot and dry conditions.  Generally, a late Summer - Fall peak is seen for common outdoor fungal spores.  Rain will temporarily lower outdoor mold spore count, but counts rise rapidly when the rainy period ends.  The most important indoor molds are Aspergillus and Penicillium.  Dark, humid and poorly ventilated basements are ideal sites for mold growth.  The next most common sites of mold growth are the bathroom and the kitchen.  Outdoor [employer] Use air conditioning and keep windows closed Avoid exposure to decaying vegetation. Avoid leaf raking. Avoid grain handling. Consider wearing a face mask if working in moldy areas.  Indoor Mold Control Maintain humidity below 50%. Clean washable surfaces with 5% bleach solution. Remove sources e.g. Contaminated carpets.  Control of Dust Mite Allergen Dust mites play a major role in allergic asthma and rhinitis. They occur in environments with high humidity wherever human skin is found. Dust mites absorb humidity from the atmosphere (ie, they do not drink) and feed on organic matter  (including shed human and animal skin). Dust mites are a microscopic type of insect that you cannot see with the naked eye. High levels of dust mites have been detected from mattresses, pillows, carpets, upholstered furniture, bed covers, clothes, soft toys and any woven material. The principal allergen of the dust mite is found in its feces. A gram of dust may contain 1,000 mites and 250,000 fecal particles. Mite antigen is easily measured in the air during house cleaning activities. Dust mites do not bite and do not cause harm to humans, other than by triggering allergies/asthma.  Ways to decrease your exposure to dust mites in your home:  1. Encase mattresses, box springs and pillows with a mite-impermeable barrier or cover  2. Wash sheets, blankets and drapes weekly in hot water (130 F) with detergent and dry them in a dryer on the hot setting.  3. Have the room cleaned frequently with a vacuum cleaner and a damp dust-mop. For carpeting or rugs, vacuuming with a vacuum cleaner equipped with a high-efficiency particulate air (HEPA) filter. The dust mite allergic individual should not be in a room which is being cleaned and should wait 1 hour after cleaning before going into the room.  4. Do not sleep on upholstered furniture (eg, couches).  5. If possible removing carpeting, upholstered furniture and drapery from the home is ideal. Horizontal blinds should be eliminated in the rooms where the person spends the most time (bedroom, study, television room). Washable vinyl, roller-type shades are optimal.  6. Remove all non-washable stuffed toys from the bedroom. Wash stuffed toys weekly like sheets and blankets above.  7. Reduce indoor humidity to less than 50%. Inexpensive humidity monitors can be purchased at most hardware stores. Do not use a humidifier as can make the problem worse and are not recommended.  Control of Dog or Cat Allergen Avoidance is the best way to manage a dog or cat  allergy. If you have a dog or cat and are allergic to dog or cats, consider removing the dog or cat from the home. If you have a dog or cat but don't want to find it a new home, or if your family wants a pet even though someone in the household is allergic, here are some strategies that may help keep symptoms at bay:  Keep the pet out of your bedroom and restrict it to only a few rooms. Be advised that keeping the dog or cat in only one room will not limit the allergens to that room. Don't pet, hug or kiss the dog or cat; if you do, wash your hands with soap and water. High-efficiency particulate air (HEPA) cleaners run continuously in a bedroom or living room can reduce allergen levels over time. Regular use of a high-efficiency vacuum cleaner or a central vacuum can reduce allergen levels. Giving your dog or cat a bath at least once a week can reduce airborne allergen.   Control of Cockroach Allergen Cockroach allergen has been identified as an important cause of acute attacks of asthma, especially in urban settings.  There are fifty-five species of cockroach that exist in the [place], however only three, the [place], [place] species produce allergen that can affect patients with  Asthma.  Allergens can be obtained from fecal particles, egg casings and secretions from cockroaches.    Remove food sources. Reduce access to water. Seal access and entry points. Spray runways with 0.5-1% Diazinon or Chlorpyrifos Blow boric acid power under stoves and refrigerator. Place bait stations (hydramethylnon) at feeding sites.

## 2022-12-21 NOTE — Progress Notes (Signed)
[place] [address] Dept: [phone]  FOLLOW UP NOTE  Patient ID: Stacey Wyatt, female    DOB: Feb 02, 1996  Age: 27 y.o. MRN: [id number] Date of Office Visit: 12/21/2022  Assessment  Chief Complaint: Medication Management (Renewal of Dupixent)  HPI Stacey Wyatt is a 27 year old female who presents to the clinic for follow-up visit.  She was last seen in this clinic on 03/06/2022 by Dr. Nelva Bush for evaluation of nasal polyposis, fungal allergic rhinitis, environmental allergic rhinitis, asthma, and atopic dermatitis.  She had a nasal surgery on 06/29/2022 and continues to follow up with Dr. Wendie Chess, ENT.   At today's visit, she reports her asthma has been moderately well-controlled with occasional cough producing clear mucus that began about 2 weeks ago.  She denies shortness of breath or wheeze with activity or rest.  Prior to 2 weeks ago, she denies cough with activity or rest.  She is not currently taking montelukast or using an asthma maintenance inhaler.  She rarely uses albuterol with relief of symptoms of asthma.  Allergic rhinitis is reported as moderately well-controlled with symptoms including clear rhinorrhea, occasional sneeze, and occasional postnasal drainage.  She is not currently taking an antihistamine, using a nasal saline rinse, or using his nasal steroid spray.  Her last environmental allergy testing was via lab on 03/06/2022 and was positive to cat, dog, grass pollen, weed pollen, mold, tree pollen, dust mites, and cockroach.  Nasal polyposis is reported as well-controlled with recovery of sense of smell and taste since her nasal surgery.  She is not currently using a nasal steroid spray.  She continues Dupixent 300 mg injections once every 2 weeks with no large or local reaction.  She reports a significant decrease in her symptoms of nasal polyposis while continuing on Dupixent injections.  She continues to follow-up with Dr. Wendie Chess, ENT  specialist.  She reports that she began to develop raised, red, pinpoint rash that occurred over her body and was not itchy in November 2023.  She denies concomitant cardiopulmonary or gastrointestinal symptoms with this rash.  She reports complete resolution of the rash without recurrence at this time.  She denies symptoms of atopic dermatitis including red or itchy areas.  She continues a daily moisturizing routine and is not needing any topical medications at this time.  Her current medications are listed in the chart.  Drug Allergies:  No Known Allergies  Physical Exam: BP (!) 140/98   Pulse 77   Temp 97.9 F (36.6 C) (Temporal)   Resp 16   Wt 213 lb 12.8 oz (97 kg)   SpO2 99%   BMI 32.51 kg/m    Physical Exam Vitals reviewed.  Constitutional:      Appearance: Normal appearance.  HENT:     Head: Normocephalic and atraumatic.     Right Ear: Tympanic membrane normal.     Left Ear: Tympanic membrane normal.     Nose:     Comments: Bilateral naris edematous and pale with clear nasal drainage noted.  Pharynx normal.  Ears normal.  Eyes normal.    Mouth/Throat:     Pharynx: Oropharynx is clear.  Eyes:     Conjunctiva/sclera: Conjunctivae normal.  Cardiovascular:     Rate and Rhythm: Normal rate and regular rhythm.     Heart sounds: Normal heart sounds. No murmur heard. Pulmonary:     Effort: Pulmonary effort is normal.     Breath sounds: Normal breath sounds.     Comments: Lungs  clear to auscultation Musculoskeletal:        General: Normal range of motion.     Cervical back: Normal range of motion and neck supple.  Skin:    General: Skin is warm and dry.  Neurological:     Mental Status: She is alert and oriented to person, place, and time.  Psychiatric:        Mood and Affect: Mood normal.        Behavior: Behavior normal.        Thought Content: Thought content normal.        Judgment: Judgment normal.     Diagnostics: FVC 2.94 which is 78% of predicted value,  FEV1 2.90 which is 90.9% of predicted value.  Spirometry indicates normal ventilatory function.  Assessment and Plan: 1. Mild intermittent asthma without complication   2. Nasal polyposis   3. Seasonal and perennial allergic rhinitis   4. Intrinsic atopic dermatitis     Meds ordered this encounter  Medications   Albuterol Sulfate (PROAIR RESPICLICK) 123XX123 (90 Base) MCG/ACT AEPB    Sig: Inhale 2 puffs into the lungs every 4 (four) hours as needed. For cough or wheeze    Dispense:  1 each    Refill:  1   Budesonide (PULMICORT FLEXHALER) 90 MCG/ACT inhaler    Sig: For asthma flare, begin Pulmicort 2 puffs twice a day for 2 weeks or until cough and wheeze free    Dispense:  1 each    Refill:  1    Please hold.  Patient will call when needed.  Thank you    Patient Instructions  Asthma Continue albuterol 2 puffs once every 4 hours as needed for cough or wheeze You may use albuterol 2 puffs 5 to 15 minutes before activity to decrease cough or wheeze For asthma flare, begin Pulmicort 90-2 puffs twice a day for 2 weeks or until cough and wheeze free  Allergic rhinitis Continue allergen avoidance measures directed toward pollen, mold, dust mite, pets, and cockroach as listed below You may take an antihistamine once a day as needed for a runny nose. Remember to rotate to a different antihistamine about every 3 months. Some examples of over the counter antihistamines include Zyrtec (cetirizine), Xyzal (levocetirizine), Allegra (fexofenadine), and Claritin (loratidine).  Begin Flonase or Nasacort 2 sprays in each nostril once a day for a stuffy nose. In the right nostril, point the applicator out toward the right ear. In the left nostril, point the applicator out toward the left ear Consider saline nasal rinses as needed for nasal symptoms. Use this before any medicated nasal sprays for best result Consider allergen immunotherapy if your symptoms are not well controlled with the treatment plan as  listed above  Atopic dermatitis/rash Continue a twice a day moisturizing routine If your symptoms re-occur, begin a journal of events that occurred for up to 6 hours before your symptoms began including foods and beverages consumed, soaps or perfumes you had contact with, and medications.   Nasal polyposis Continue Dupixent 300 mg injections once every 2 weeks Begin Flonase or Nasacort 2 sprays in each nostril once a day to keep nasal polyps controlled  Call the clinic if this treatment plan is not working well for you.  Follow up in 6 months or sooner if needed.   Return in about 6 months (around 06/23/2023), or if symptoms worsen or fail to improve.    Thank you for the opportunity to care for this patient.  Please do  not hesitate to contact me with questions.  Gareth Morgan, FNP [facility]

## 2022-12-21 NOTE — Addendum Note (Signed)
Addended by: Eloy End D on: 12/21/2022 05:19 PM   Modules accepted: Orders

## 2022-12-23 ENCOUNTER — Other Ambulatory Visit (HOSPITAL_COMMUNITY): Payer: Self-pay

## 2023-01-04 ENCOUNTER — Ambulatory Visit: Payer: BC Managed Care – PPO

## 2023-01-05 ENCOUNTER — Ambulatory Visit (INDEPENDENT_AMBULATORY_CARE_PROVIDER_SITE_OTHER): Payer: BC Managed Care – PPO

## 2023-01-05 DIAGNOSIS — J339 Nasal polyp, unspecified: Secondary | ICD-10-CM | POA: Diagnosis not present

## 2023-01-05 MED ORDER — DUPILUMAB 300 MG/2ML ~~LOC~~ SOSY
300.0000 mg | PREFILLED_SYRINGE | SUBCUTANEOUS | Status: AC
  Administered 2023-01-05 – 2023-11-10 (×17): 300 mg via SUBCUTANEOUS

## 2023-01-19 ENCOUNTER — Ambulatory Visit (INDEPENDENT_AMBULATORY_CARE_PROVIDER_SITE_OTHER): Payer: BC Managed Care – PPO

## 2023-01-19 DIAGNOSIS — J339 Nasal polyp, unspecified: Secondary | ICD-10-CM | POA: Diagnosis not present

## 2023-02-02 ENCOUNTER — Ambulatory Visit: Payer: BC Managed Care – PPO

## 2023-02-03 ENCOUNTER — Ambulatory Visit (INDEPENDENT_AMBULATORY_CARE_PROVIDER_SITE_OTHER): Payer: BC Managed Care – PPO | Admitting: *Deleted

## 2023-02-03 DIAGNOSIS — J339 Nasal polyp, unspecified: Secondary | ICD-10-CM

## 2023-02-05 ENCOUNTER — Other Ambulatory Visit: Payer: Self-pay

## 2023-02-05 MED ORDER — METRONIDAZOLE 500 MG PO TABS: 500.0000 mg | ORAL_TABLET | Freq: Two times a day (BID) | ORAL | 0 refills | Status: DC

## 2023-02-18 ENCOUNTER — Ambulatory Visit: Payer: BC Managed Care – PPO

## 2023-02-23 ENCOUNTER — Ambulatory Visit: Payer: BC Managed Care – PPO

## 2023-02-26 ENCOUNTER — Ambulatory Visit: Payer: BC Managed Care – PPO

## 2023-03-04 ENCOUNTER — Ambulatory Visit (INDEPENDENT_AMBULATORY_CARE_PROVIDER_SITE_OTHER): Payer: BC Managed Care – PPO

## 2023-03-04 ENCOUNTER — Encounter: Payer: Self-pay | Admitting: Internal Medicine

## 2023-03-04 DIAGNOSIS — J339 Nasal polyp, unspecified: Secondary | ICD-10-CM

## 2023-03-04 NOTE — Progress Notes (Signed)
Patient's Dupixent was sent over by [facility] and appointment made for her to be seen in [facility].  She states she did not approve to have visit in [facility].  It was not convenient for her and her job.  Patient requested to have them all returned to [place].  Next appointment was made for [facility].

## 2023-03-18 ENCOUNTER — Ambulatory Visit: Payer: BC Managed Care – PPO

## 2023-03-21 ENCOUNTER — Other Ambulatory Visit: Payer: Self-pay

## 2023-03-21 ENCOUNTER — Emergency Department (HOSPITAL_COMMUNITY)
Admission: EM | Admit: 2023-03-21 | Discharge: 2023-03-21 | Disposition: A | Discharge status: Home / Self Care | Payer: BC Managed Care – PPO | Attending: Emergency Medicine | Admitting: Emergency Medicine

## 2023-03-21 ENCOUNTER — Encounter (HOSPITAL_COMMUNITY): Payer: Self-pay | Admitting: *Deleted

## 2023-03-21 ENCOUNTER — Emergency Department (HOSPITAL_COMMUNITY): Payer: BC Managed Care – PPO

## 2023-03-21 DIAGNOSIS — S39012A Strain of muscle, fascia and tendon of lower back, initial encounter: Secondary | ICD-10-CM

## 2023-03-21 DIAGNOSIS — M62830 Muscle spasm of back: Secondary | ICD-10-CM

## 2023-03-21 DIAGNOSIS — M5136 Other intervertebral disc degeneration, lumbar region: Secondary | ICD-10-CM

## 2023-03-21 DIAGNOSIS — M545 Low back pain, unspecified: Secondary | ICD-10-CM | POA: Diagnosis not present

## 2023-03-21 DIAGNOSIS — M549 Dorsalgia, unspecified: Secondary | ICD-10-CM | POA: Diagnosis not present

## 2023-03-21 LAB — I-STAT BETA HCG BLOOD, ED (MC, WL, AP ONLY): I-stat hCG, quantitative: <5 m[IU]/mL (ref <5)

## 2023-03-21 MED ORDER — DEXAMETHASONE SODIUM PHOSPHATE 10 MG/ML IJ SOLN
10.0000 mg | Freq: Once | INTRAMUSCULAR | Status: AC
  Administered 2023-03-21: 10 mg via INTRAVENOUS
  Filled 2023-03-21: qty 1

## 2023-03-21 MED ORDER — DIAZEPAM 5 MG PO TABS
5.0000 mg | ORAL_TABLET | Freq: Once | ORAL | Status: AC
  Administered 2023-03-21: 5 mg via ORAL
  Filled 2023-03-21: qty 1

## 2023-03-21 MED ORDER — ACETAMINOPHEN 500 MG PO TABS: 500.0000 mg | ORAL_TABLET | Freq: Four times a day (QID) | ORAL | 0 refills | Status: AC | PRN

## 2023-03-21 MED ORDER — DEXAMETHASONE SODIUM PHOSPHATE 10 MG/ML IJ SOLN
8.0000 mg | Freq: Once | INTRAMUSCULAR | Status: AC
  Administered 2023-03-21: 8 mg via INTRAVENOUS
  Filled 2023-03-21: qty 1

## 2023-03-21 MED ORDER — METHOCARBAMOL 500 MG PO TABS: 500.0000 mg | ORAL_TABLET | Freq: Two times a day (BID) | ORAL | 0 refills | Status: AC

## 2023-03-21 MED ORDER — IBUPROFEN 600 MG PO TABS: 600.0000 mg | ORAL_TABLET | Freq: Four times a day (QID) | ORAL | 0 refills | Status: AC | PRN

## 2023-03-21 MED ORDER — HYDROMORPHONE HCL 1 MG/ML IJ SOLN
1.0000 mg | Freq: Once | INTRAMUSCULAR | Status: AC
  Administered 2023-03-21: 1 mg via INTRAVENOUS
  Filled 2023-03-21: qty 1

## 2023-03-21 MED ORDER — OXYCODONE-ACETAMINOPHEN 5-325 MG PO TABS
1.0000 | ORAL_TABLET | Freq: Once | ORAL | Status: AC
  Administered 2023-03-21: 1 via ORAL
  Filled 2023-03-21: qty 1

## 2023-03-21 MED ORDER — KETOROLAC TROMETHAMINE 15 MG/ML IJ SOLN
15.0000 mg | Freq: Once | INTRAMUSCULAR | Status: AC
  Administered 2023-03-21: 15 mg via INTRAVENOUS
  Filled 2023-03-21: qty 1

## 2023-03-21 MED ORDER — IBUPROFEN 800 MG PO TABS
800.0000 mg | ORAL_TABLET | Freq: Once | ORAL | Status: AC
  Administered 2023-03-21: 800 mg via ORAL
  Filled 2023-03-21: qty 1

## 2023-03-21 MED ORDER — METHYLPREDNISOLONE 4 MG PO TBPK: ORAL_TABLET | ORAL | 0 refills | Status: AC

## 2023-03-21 MED ORDER — LORAZEPAM 1 MG PO TABS: 1.0000 mg | ORAL_TABLET | Freq: Once | ORAL | Status: DC | PRN

## 2023-03-21 NOTE — ED Notes (Signed)
Pt returned from MRI °

## 2023-03-21 NOTE — ED Provider Notes (Signed)
[place] EMERGENCY DEPARTMENT AT [facility] Provider Note   CSN: [id number] Arrival date & time: 03/21/23  1853     History  Chief Complaint  Patient presents with   Back Pain    Stacey Wyatt is a 27 y.o. female.  HPI     27 year old patient comes in with chief complaint of severe back pain.  Patient was bending at 2:30 PM, when she had sudden severe back pain.  She has been laying in the bed since then, but more than 4 hours later she still having significant pain and unable to move.  Therefore she came to the emergency room.  Patient states that the back is in the lower back and it is radiating down to her toes.  She also has numbness radiating down both her toes.  She has not urinated since the back pain started.  She lives at home with a 1-year-old.  No other family nearby.  Home Medications Prior to Admission medications   Medication Sig Start Date End Date Taking? Authorizing Provider  metroNIDAZOLE (FLAGYL) 500 MG tablet Take 1 tablet (500 mg total) by mouth 2 (two) times daily. 02/05/23   Levie Heritage, DO  Albuterol Sulfate (PROAIR RESPICLICK) 108 (90 Base) MCG/ACT AEPB Inhale 2 puffs into the lungs every 4 (four) hours as needed. For cough or wheeze 12/21/22   Ambs, Norvel Richards, FNP  Budesonide (PULMICORT FLEXHALER) 90 MCG/ACT inhaler For asthma flare, begin Pulmicort 2 puffs twice a day for 2 weeks or until cough and wheeze free 12/21/22   Hetty Blend, FNP  DUPIXENT 300 MG/2ML prefilled syringe  08/28/22   [provider]      Allergies    Patient has no known allergies.    Review of Systems   Review of Systems  All other systems reviewed and are negative.   Physical Exam Updated Vital Signs BP (!) 125/91   Pulse (!) 58   Temp 98.7 F (37.1 C) (Oral)   Resp 17   LMP 02/25/2023 (Approximate)   SpO2 100%  Physical Exam Vitals and nursing note reviewed.  Constitutional:      Appearance: She is well-developed.  HENT:     Head:  Normocephalic and atraumatic.  Eyes:     Extraocular Movements: Extraocular movements intact.  Cardiovascular:     Rate and Rhythm: Normal rate.  Pulmonary:     Effort: Pulmonary effort is normal.  Musculoskeletal:     Cervical back: Normal range of motion and neck supple.  Skin:    General: Skin is dry.  Neurological:     Mental Status: She is alert and oriented to person, place, and time.     Sensory: Sensory deficit present.     Motor: No weakness.     Comments: Poor bilateral lower extremity patellar reflexes. Patient has positive passive leg raise bilaterally     ED Results / Procedures / Treatments   Labs (all labs ordered are listed, but only abnormal results are displayed) Labs Reviewed  URINALYSIS, ROUTINE W REFLEX MICROSCOPIC  PREGNANCY, URINE  I-STAT BETA HCG BLOOD, ED ([facility], [facility], [facility] ONLY)    EKG None  Radiology No results found.  Procedures Procedures    Medications Ordered in ED Medications  LORazepam (ATIVAN) tablet 1 mg (has no administration in time range)  oxyCODONE-acetaminophen (PERCOCET/ROXICET) 5-325 MG per tablet 1 tablet (1 tablet Oral Given 03/21/23 1925)  ibuprofen (ADVIL) tablet 800 mg (800 mg Oral Given 03/21/23 1925)  HYDROmorphone (DILAUDID)  injection 1 mg (1 mg Intravenous Given 03/21/23 2022)  ketorolac (TORADOL) 15 MG/ML injection 15 mg (15 mg Intravenous Given 03/21/23 2020)  dexamethasone (DECADRON) injection 10 mg (10 mg Intravenous Given 03/21/23 2019)  diazepam (VALIUM) tablet 5 mg (5 mg Oral Given 03/21/23 2017)    ED Course/ Medical Decision Making/ A&P                             Medical Decision Making Amount and/or Complexity of Data Reviewed Radiology: ordered.  Risk Prescription drug management.   Patient comes in with severe back pain.  Back pain is radiating down both of her legs and there is also numbness and tingling radiating down both of her legs.  She has not voided since the injury that occurred more than 6 hours  prior to my assessment.  Patient has poor patellar reflexes.  She has subjective sensory deficits in both of her lower extremities.  Plan is to get MRI of the lumbar spine.  If the MRI is negative, patient can be managed conservatively with follow-up with neurosurgery.  Differential diagnosis for this patient includes: - DJD of the back - Spondylitises/ spondylosis - Sciatica - Spinal cord compression - Conus medullaris - Musculoskeletal pain  Patient's care has been signed out to incoming team. Final Clinical Impression(s) / ED Diagnoses Final diagnoses:  None    Rx / DC Orders ED Discharge Orders     None         Derwood Kaplan, MD 03/21/23 2107

## 2023-03-21 NOTE — ED Triage Notes (Signed)
Pt here via [facility] after severe back pain from bending over.  Incident happened at 1430.  Pt has taken no pain medications.  Given hot packs.

## 2023-03-21 NOTE — ED Provider Triage Note (Signed)
Emergency Medicine Provider Triage Evaluation Note  Stacey Wyatt , a 27 y.o. female  was evaluated in triage.  Pt complains of sudden onset of low back pain.  Pt reports she was reaching over.  Pt complains of severe pain with movement.  No uti symptoms    Review of Systems  Positive: Back pain Negative: fever  Physical Exam  BP (!) 130/96 (BP Location: Right Arm)   Pulse 89   Temp 98.7 F (37.1 C) (Oral)   Resp 16   LMP 02/25/2023 (Approximate)   SpO2 100%  Gen:   Awake, no distress   Resp:  Normal effort  MSK:   Tender ls spine  pain with movement   Other:    Medical Decision Making  Medically screening exam initiated at 7:12 PM.  Appropriate orders placed.  Stacey Wyatt was informed that the remainder of the evaluation will be completed by another provider, this initial triage assessment does not replace that evaluation, and the importance of remaining in the ED until their evaluation is complete.     Elson Areas, [place] 03/21/23 1914

## 2023-03-21 NOTE — ED Notes (Signed)
Patient transported to MRI 

## 2023-03-22 ENCOUNTER — Ambulatory Visit: Payer: BC Managed Care – PPO

## 2023-03-22 ENCOUNTER — Ambulatory Visit (INDEPENDENT_AMBULATORY_CARE_PROVIDER_SITE_OTHER): Payer: BC Managed Care – PPO | Admitting: *Deleted

## 2023-03-22 DIAGNOSIS — J339 Nasal polyp, unspecified: Secondary | ICD-10-CM

## 2023-03-22 NOTE — ED Provider Notes (Signed)
  Physical Exam  BP (!) 125/91   Pulse (!) 58   Temp 98.5 F (36.9 C) (Oral)   Resp 17   LMP 02/25/2023 (Approximate)   SpO2 100%   Physical Exam Vitals and nursing note reviewed.  Constitutional:      General: She is not in acute distress.    Appearance: She is well-developed.  HENT:     Head: Normocephalic and atraumatic.  Eyes:     Conjunctiva/sclera: Conjunctivae normal.  Cardiovascular:     Rate and Rhythm: Normal rate and regular rhythm.     Heart sounds: No murmur heard. Pulmonary:     Effort: Pulmonary effort is normal. No respiratory distress.     Breath sounds: Normal breath sounds.  Abdominal:     Palpations: Abdomen is soft.     Tenderness: There is no abdominal tenderness.  Musculoskeletal:        General: No swelling.     Cervical back: Neck supple.  Skin:    General: Skin is warm and dry.     Capillary Refill: Capillary refill takes less than 2 seconds.  Neurological:     Mental Status: She is alert.  Psychiatric:        Mood and Affect: Mood normal.     Procedures  Procedures  ED Course / MDM    Medical Decision Making Amount and/or Complexity of Data Reviewed Radiology: ordered.  Risk OTC drugs. Prescription drug management.   Patient received in handoff.  Back pain with sciatica pending MRI.  MRI with a subarticular disc protrusion with annular fissure at L6-S1 with some descending right L6 nerve compression but no spinal stenosis.  Patient will be started on a steroid taper and discharged with outpatient follow-up.       Glendora Score, MD 03/22/23 [phone]

## 2023-04-05 ENCOUNTER — Ambulatory Visit: Payer: BC Managed Care – PPO

## 2023-04-05 DIAGNOSIS — J339 Nasal polyp, unspecified: Secondary | ICD-10-CM | POA: Diagnosis not present

## 2023-04-21 ENCOUNTER — Ambulatory Visit (INDEPENDENT_AMBULATORY_CARE_PROVIDER_SITE_OTHER): Payer: BC Managed Care – PPO | Admitting: *Deleted

## 2023-04-21 DIAGNOSIS — J339 Nasal polyp, unspecified: Secondary | ICD-10-CM | POA: Diagnosis not present

## 2023-05-06 ENCOUNTER — Ambulatory Visit: Payer: BC Managed Care – PPO

## 2023-05-11 ENCOUNTER — Ambulatory Visit (INDEPENDENT_AMBULATORY_CARE_PROVIDER_SITE_OTHER): Payer: BC Managed Care – PPO

## 2023-05-11 DIAGNOSIS — J339 Nasal polyp, unspecified: Secondary | ICD-10-CM | POA: Diagnosis not present

## 2023-05-24 ENCOUNTER — Ambulatory Visit (INDEPENDENT_AMBULATORY_CARE_PROVIDER_SITE_OTHER): Payer: BC Managed Care – PPO | Admitting: *Deleted

## 2023-05-24 DIAGNOSIS — J339 Nasal polyp, unspecified: Secondary | ICD-10-CM

## 2023-05-25 ENCOUNTER — Ambulatory Visit: Payer: BC Managed Care – PPO

## 2023-05-26 DIAGNOSIS — Z114 Encounter for screening for human immunodeficiency virus [HIV]: Secondary | ICD-10-CM | POA: Diagnosis not present

## 2023-05-26 DIAGNOSIS — Z113 Encounter for screening for infections with a predominantly sexual mode of transmission: Secondary | ICD-10-CM | POA: Diagnosis not present

## 2023-06-08 ENCOUNTER — Ambulatory Visit: Payer: BC Managed Care – PPO

## 2023-06-22 ENCOUNTER — Ambulatory Visit (INDEPENDENT_AMBULATORY_CARE_PROVIDER_SITE_OTHER): Payer: BC Managed Care – PPO | Admitting: *Deleted

## 2023-06-22 DIAGNOSIS — J339 Nasal polyp, unspecified: Secondary | ICD-10-CM

## 2023-07-06 ENCOUNTER — Ambulatory Visit: Payer: BC Managed Care – PPO | Admitting: *Deleted

## 2023-07-06 DIAGNOSIS — J339 Nasal polyp, unspecified: Secondary | ICD-10-CM | POA: Diagnosis not present

## 2023-07-20 ENCOUNTER — Ambulatory Visit: Payer: BC Managed Care – PPO | Admitting: *Deleted

## 2023-07-20 DIAGNOSIS — J339 Nasal polyp, unspecified: Secondary | ICD-10-CM | POA: Diagnosis not present

## 2023-07-31 DIAGNOSIS — F4323 Adjustment disorder with mixed anxiety and depressed mood: Secondary | ICD-10-CM | POA: Diagnosis not present

## 2023-08-03 ENCOUNTER — Ambulatory Visit (INDEPENDENT_AMBULATORY_CARE_PROVIDER_SITE_OTHER): Payer: BC Managed Care – PPO | Admitting: *Deleted

## 2023-08-03 DIAGNOSIS — J339 Nasal polyp, unspecified: Secondary | ICD-10-CM | POA: Diagnosis not present

## 2023-08-07 DIAGNOSIS — F4323 Adjustment disorder with mixed anxiety and depressed mood: Secondary | ICD-10-CM | POA: Diagnosis not present

## 2023-08-11 DIAGNOSIS — M5442 Lumbago with sciatica, left side: Secondary | ICD-10-CM | POA: Diagnosis not present

## 2023-08-11 DIAGNOSIS — R42 Dizziness and giddiness: Secondary | ICD-10-CM | POA: Diagnosis not present

## 2023-08-11 DIAGNOSIS — M5441 Lumbago with sciatica, right side: Secondary | ICD-10-CM | POA: Diagnosis not present

## 2023-08-11 DIAGNOSIS — G8929 Other chronic pain: Secondary | ICD-10-CM | POA: Diagnosis not present

## 2023-08-14 DIAGNOSIS — F4323 Adjustment disorder with mixed anxiety and depressed mood: Secondary | ICD-10-CM | POA: Diagnosis not present

## 2023-08-17 ENCOUNTER — Ambulatory Visit: Payer: BC Managed Care – PPO

## 2023-08-18 ENCOUNTER — Ambulatory Visit: Payer: BC Managed Care – PPO

## 2023-08-18 DIAGNOSIS — J339 Nasal polyp, unspecified: Secondary | ICD-10-CM

## 2023-08-22 ENCOUNTER — Emergency Department: Payer: BC Managed Care – PPO

## 2023-08-22 ENCOUNTER — Emergency Department
Admission: EM | Admit: 2023-08-22 | Discharge: 2023-08-22 | Disposition: A | Discharge status: Home / Self Care | Payer: BC Managed Care – PPO | Attending: Emergency Medicine | Admitting: Emergency Medicine

## 2023-08-22 DIAGNOSIS — S022XXA Fracture of nasal bones, initial encounter for closed fracture: Secondary | ICD-10-CM | POA: Diagnosis not present

## 2023-08-22 DIAGNOSIS — S0990XA Unspecified injury of head, initial encounter: Secondary | ICD-10-CM | POA: Insufficient documentation

## 2023-08-22 DIAGNOSIS — J321 Chronic frontal sinusitis: Secondary | ICD-10-CM | POA: Diagnosis not present

## 2023-08-22 DIAGNOSIS — R000 Tachycardia, unspecified: Secondary | ICD-10-CM | POA: Diagnosis not present

## 2023-08-22 DIAGNOSIS — J342 Deviated nasal septum: Secondary | ICD-10-CM | POA: Diagnosis not present

## 2023-08-22 DIAGNOSIS — R58 Hemorrhage, not elsewhere classified: Secondary | ICD-10-CM | POA: Diagnosis not present

## 2023-08-22 MED ORDER — IBUPROFEN 600 MG PO TABS
600.0000 mg | ORAL_TABLET | Freq: Once | ORAL | Status: AC
  Administered 2023-08-22: 600 mg via ORAL
  Filled 2023-08-22: qty 1

## 2023-08-22 MED ORDER — ACETAMINOPHEN 500 MG PO TABS
1000.0000 mg | ORAL_TABLET | Freq: Once | ORAL | Status: AC
  Administered 2023-08-22: 1000 mg via ORAL
  Filled 2023-08-22: qty 2
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
## 2023-08-22 NOTE — ED Notes (Signed)
Patient transported to CT 

## 2023-08-22 NOTE — ED Notes (Signed)
Pt is alert and oriented x4. Pt is on the phone speaking to a relative. No bleeding observed.

## 2023-08-22 NOTE — ED Notes (Signed)
Pt was given an ice pack for the discomfort to her lips.

## 2023-08-26 DIAGNOSIS — Z114 Encounter for screening for human immunodeficiency virus [HIV]: Secondary | ICD-10-CM | POA: Diagnosis not present

## 2023-08-26 DIAGNOSIS — N760 Acute vaginitis: Secondary | ICD-10-CM | POA: Diagnosis not present

## 2023-08-26 DIAGNOSIS — A6000 Herpesviral infection of urogenital system, unspecified: Secondary | ICD-10-CM | POA: Diagnosis not present

## 2023-08-26 DIAGNOSIS — Z113 Encounter for screening for infections with a predominantly sexual mode of transmission: Secondary | ICD-10-CM | POA: Diagnosis not present

## 2023-08-28 DIAGNOSIS — F4323 Adjustment disorder with mixed anxiety and depressed mood: Secondary | ICD-10-CM | POA: Diagnosis not present

## 2023-08-30 DIAGNOSIS — B009 Herpesviral infection, unspecified: Secondary | ICD-10-CM

## 2023-08-30 HISTORY — DX: Herpesviral infection, unspecified: B00.9

## 2023-09-01 ENCOUNTER — Ambulatory Visit: Payer: BC Managed Care – PPO

## 2023-09-17 ENCOUNTER — Ambulatory Visit: Payer: BC Managed Care – PPO | Admitting: *Deleted

## 2023-09-17 DIAGNOSIS — J339 Nasal polyp, unspecified: Secondary | ICD-10-CM | POA: Diagnosis not present

## 2023-09-22 DIAGNOSIS — A6000 Herpesviral infection of urogenital system, unspecified: Secondary | ICD-10-CM | POA: Diagnosis not present

## 2023-09-22 DIAGNOSIS — Z113 Encounter for screening for infections with a predominantly sexual mode of transmission: Secondary | ICD-10-CM | POA: Diagnosis not present

## 2023-09-22 DIAGNOSIS — N760 Acute vaginitis: Secondary | ICD-10-CM | POA: Diagnosis not present

## 2023-09-23 DIAGNOSIS — M5451 Vertebrogenic low back pain: Secondary | ICD-10-CM | POA: Diagnosis not present

## 2023-09-24 DIAGNOSIS — M5451 Vertebrogenic low back pain: Secondary | ICD-10-CM | POA: Diagnosis not present

## 2023-09-30 ENCOUNTER — Ambulatory Visit: Payer: BC Managed Care – PPO | Admitting: Family Medicine

## 2023-09-30 ENCOUNTER — Encounter: Payer: Self-pay | Admitting: Family Medicine

## 2023-10-04 ENCOUNTER — Ambulatory Visit: Payer: BC Managed Care – PPO

## 2023-10-18 ENCOUNTER — Ambulatory Visit: Payer: BC Managed Care – PPO

## 2023-10-18 DIAGNOSIS — F4323 Adjustment disorder with mixed anxiety and depressed mood: Secondary | ICD-10-CM | POA: Diagnosis not present

## 2023-10-20 ENCOUNTER — Ambulatory Visit: Payer: BC Managed Care – PPO | Admitting: Family Medicine

## 2023-10-20 ENCOUNTER — Other Ambulatory Visit (HOSPITAL_COMMUNITY)
Admission: RE | Admit: 2023-10-20 | Discharge: 2023-10-20 | Disposition: A | Discharge status: Home / Self Care | Payer: BC Managed Care – PPO | Source: Ambulatory Visit | Attending: Family Medicine | Admitting: Family Medicine

## 2023-10-20 ENCOUNTER — Encounter: Payer: Self-pay | Admitting: Family Medicine

## 2023-10-20 VITALS — BP 142/97 | HR 78 | Ht 69.0 in | Wt 201.0 lb

## 2023-10-20 DIAGNOSIS — Z01419 Encounter for gynecological examination (general) (routine) without abnormal findings: Secondary | ICD-10-CM | POA: Diagnosis not present

## 2023-10-20 DIAGNOSIS — N760 Acute vaginitis: Secondary | ICD-10-CM | POA: Diagnosis not present

## 2023-10-20 DIAGNOSIS — Z113 Encounter for screening for infections with a predominantly sexual mode of transmission: Secondary | ICD-10-CM | POA: Insufficient documentation

## 2023-10-20 DIAGNOSIS — Z1339 Encounter for screening examination for other mental health and behavioral disorders: Secondary | ICD-10-CM

## 2023-10-20 DIAGNOSIS — A609 Anogenital herpesviral infection, unspecified: Secondary | ICD-10-CM

## 2023-10-20 DIAGNOSIS — I10 Essential (primary) hypertension: Secondary | ICD-10-CM

## 2023-10-20 DIAGNOSIS — B9689 Other specified bacterial agents as the cause of diseases classified elsewhere: Secondary | ICD-10-CM | POA: Diagnosis not present

## 2023-10-20 MED ORDER — VALACYCLOVIR HCL 500 MG PO TABS: 500.0000 mg | ORAL_TABLET | Freq: Every day | ORAL | 3 refills | Status: AC

## 2023-10-20 MED ORDER — METRONIDAZOLE 1.3 % VA GEL: 1.0000 | Freq: Every day | VAGINAL | 0 refills | Status: AC

## 2023-10-20 NOTE — Progress Notes (Signed)
 ANNUAL EXAM Patient name: Stacey Wyatt MRN [id number]  Date of birth: [birth year] Chief Complaint:   Annual Exam  History of Present Illness:   Stacey Wyatt is a 28 y.o.  [phone]  female  being seen today for a routine annual exam.  Current complaints: [facility] for painful vulvar lesions. Had HSV 1 dx'd with culture. No other painful lesion episodes. Had other STI testing done - all normal.  Patient's last menstrual period was 09/27/2023 (exact date).   Upstream - 10/20/23 0916       Pregnancy Intention Screening   Does the patient want to become pregnant in the next year? No    Does the patient's partner want to become pregnant in the next year? Unsure    Would the patient like to discuss contraceptive options today? No      Contraception Wrap Up   Current Method Withdrawal or Other Method             Last pap 06/2020. Results were: NILM w/ HRHPV not done. H/O abnormal pap: no Last mammogram: n/a     10/20/2023    9:26 AM  Depression screen PHQ 2/9  Decreased Interest 0  Down, Depressed, Hopeless 0  PHQ - 2 Score 0  Tired, decreased energy 1  Change in appetite 0  Feeling bad or failure about yourself  1  Trouble concentrating 0  Moving slowly or fidgety/restless 0  Suicidal thoughts 0        10/20/2023    9:26 AM  GAD 7 : Generalized Anxiety Score  Nervous, Anxious, on Edge 0  Control/stop worrying 0  Worry too much - different things 0  Trouble relaxing 0  Restless 0  Easily annoyed or irritable 0  Afraid - awful might happen 0  Total GAD 7 Score 0     Review of Systems:   Pertinent items are noted in HPI Denies any headaches, blurred vision, fatigue, shortness of breath, chest pain, abdominal pain, abnormal vaginal discharge/itching/odor/irritation, problems with periods, bowel movements, urination, or intercourse unless otherwise stated above. Pertinent History Reviewed:  Reviewed past medical,surgical, social and family  history.  Reviewed problem list, medications and allergies. Physical Assessment:   Vitals:   10/20/23 0914 10/20/23 0926  BP: (!) 127/93 (!) 142/97  Pulse: 81 78  Weight: 201 lb (91.2 kg)   Height: 5\' 9"  (1.753 m)   Body mass index is 29.68 kg/m.        Physical Examination:   General appearance - well appearing, and in no distress  Mental status - alert, oriented to person, place, and time  Psych:  She has a normal mood and affect  Skin - warm and dry, normal color, no suspicious lesions noted  Chest - effort normal, all lung fields clear to auscultation bilaterally  Heart - normal rate and regular rhythm  Neck:  midline trachea, no thyromegaly or nodules  Breasts - breasts appear normal, no suspicious masses, no skin or nipple changes or axillary nodes  Abdomen - soft, nontender, nondistended, no masses or organomegaly  Pelvic - VULVA: normal appearing vulva with no masses, tenderness or lesions  VAGINA: normal appearing vagina with normal color and discharge, no lesions  CERVIX: normal appearing cervix without discharge or lesions, no CMT  Thin prep pap is done with HR HPV cotesting  UTERUS: uterus is felt to be normal size, shape, consistency and nontender   ADNEXA: No adnexal masses or tenderness noted.  Extremities:  No swelling or varicosities noted  Chaperone present for exam  Assessment & Plan:  1. Well woman exam with routine gynecological exam (Primary)  - Ambulatory referral to Family Practice - Cytology - PAP - Cervicovaginal ancillary only  2. Screening for STDs (sexually transmitted diseases) - Cervicovaginal ancillary only  3. Recurrent vaginitis Treat with metronidazole  gel.  Discussed boric acid. May need prolonged suppression if fails metronizalone gel  4. HSV (herpes simplex virus) anogenital infection Patient would like suppression  5. Primary hypertension Referred to primary care   Labs/procedures today:  Orders Placed This Encounter   Procedures   Ambulatory referral to Family Practice    Meds:  Meds ordered this encounter  Medications   valACYclovir  (VALTREX ) 500 MG tablet    Sig: Take 1 tablet (500 mg total) by mouth daily. Can increase to twice a day for 5 days in the event of a recurrence    Dispense:  90 tablet    Refill:  3   metroNIDAZOLE  1.3 % GEL    Sig: Place 1 applicator vaginally at bedtime for 1 day.    Dispense:  5 g    Refill:  0    Follow-up: No follow-ups on file.  Mihir Flanigan J Mckynleigh Mussell, DO 10/20/2023 10:01 AM

## 2023-10-22 ENCOUNTER — Encounter: Payer: Self-pay | Admitting: Family Medicine

## 2023-10-22 LAB — CYTOLOGY - PAP
Diagnosis: NEGATIVE
Diagnosis: REACTIVE

## 2023-10-22 LAB — CERVICOVAGINAL ANCILLARY ONLY
Bacterial Vaginitis (gardnerella): POSITIVE — AB
Candida Glabrata: NEGATIVE
Candida Vaginitis: NEGATIVE
Chlamydia: NEGATIVE
Comment: NEGATIVE
Comment: NEGATIVE
Comment: NEGATIVE
Comment: NEGATIVE
Comment: NEGATIVE
Comment: NORMAL
Neisseria Gonorrhea: NEGATIVE
Trichomonas: NEGATIVE

## 2023-10-22 MED ORDER — METRONIDAZOLE 1.3 % VA GEL: 1.0000 | Freq: Every day | VAGINAL | 0 refills | Status: DC

## 2023-10-22 NOTE — Addendum Note (Signed)
Addended by: Levie Heritage on: 10/22/2023 04:26 PM   Modules accepted: Orders

## 2023-10-27 ENCOUNTER — Ambulatory Visit: Payer: BC Managed Care – PPO | Admitting: *Deleted

## 2023-10-27 DIAGNOSIS — J339 Nasal polyp, unspecified: Secondary | ICD-10-CM

## 2023-10-27 MED ORDER — METRONIDAZOLE 1.3 % VA GEL: 1.0000 | Freq: Every day | VAGINAL | 0 refills | Status: AC

## 2023-11-02 ENCOUNTER — Encounter (HOSPITAL_COMMUNITY): Payer: Self-pay

## 2023-11-02 ENCOUNTER — Emergency Department (HOSPITAL_COMMUNITY)
Admission: EM | Admit: 2023-11-02 | Discharge: 2023-11-03 | Discharge status: Against Medical Advice | Payer: BC Managed Care – PPO | Attending: Emergency Medicine | Admitting: Emergency Medicine

## 2023-11-02 DIAGNOSIS — Z5321 Procedure and treatment not carried out due to patient leaving prior to being seen by health care provider: Secondary | ICD-10-CM | POA: Insufficient documentation

## 2023-11-02 DIAGNOSIS — R519 Headache, unspecified: Secondary | ICD-10-CM | POA: Insufficient documentation

## 2023-11-02 LAB — CBC
HCT: 38.6 % (ref 36.0–46.0)
Hemoglobin: 13.4 g/dL (ref 12.0–15.0)
MCH: 30.4 pg (ref 26.0–34.0)
MCHC: 34.7 g/dL (ref 30.0–36.0)
MCV: 87.5 fL (ref 80.0–100.0)
Platelets: 316 10*3/uL (ref 150–400)
RBC: 4.41 MIL/uL (ref 3.87–5.11)
RDW: 12.9 % (ref 11.5–15.5)
WBC: 4.7 10*3/uL (ref 4.0–10.5)
nRBC: 0 % (ref 0.0–0.2)

## 2023-11-02 LAB — BASIC METABOLIC PANEL
Anion gap: 9 (ref 5–15)
BUN: 11 mg/dL (ref 6–20)
CO2: 25 mmol/L (ref 22–32)
Calcium: 9.5 mg/dL (ref 8.9–10.3)
Chloride: 104 mmol/L (ref 98–111)
Creatinine, Ser: 0.68 mg/dL (ref 0.44–1.00)
GFR, Estimated: >60 mL/min (ref >60)
Glucose, Bld: 102 mg/dL — ABNORMAL HIGH (ref 70–99)
Potassium: 4 mmol/L (ref 3.5–5.1)
Sodium: 138 mmol/L (ref 135–145)

## 2023-11-02 LAB — HCG, SERUM, QUALITATIVE: Preg, Serum: NEGATIVE

## 2023-11-02 MED ORDER — ACETAMINOPHEN 325 MG PO TABS
650.0000 mg | ORAL_TABLET | Freq: Once | ORAL | Status: AC
  Administered 2023-11-02: 650 mg via ORAL
  Filled 2023-11-02: qty 2

## 2023-11-02 MED ORDER — HYDRALAZINE HCL 20 MG/ML IJ SOLN
2.0000 mg | Freq: Once | INTRAMUSCULAR | Status: AC
  Administered 2023-11-02: 2 mg via INTRAMUSCULAR
  Filled 2023-11-02: qty 1

## 2023-11-02 NOTE — ED Triage Notes (Signed)
Pt is coming in for a headache that is more than likely related to some HTN she has been experiencing. The HTN was noticed at her most recent OBGYN appointment in which they referred her to a PCP for further evaluation, she mentions she has had a headache for around 1 week striaght now and she has been taking motrin everyday for it with no effect. She did have her blood pressure taken by her neighbor as well. Which read high.

## 2023-11-02 NOTE — ED Provider Triage Note (Signed)
Emergency Medicine Provider Triage Evaluation Note  Stacey Wyatt , a 28 y.o. female  was evaluated in triage.  Pt complains of headache. Patient reports headache for the past week that has not improved with Advil. Additionally, she has been having elevated blood pressure readings since she saw her OBGYN on 1/15. Has family history of hypertension but has never been diagnosed herself.  Review of Systems  Positive: Headache, elevated BP Negative: CP, SOB  Physical Exam  BP (!) 158/111   Pulse 80   Temp 98.2 F (36.8 C) (Oral)   Resp 18   LMP 09/27/2023 (Exact Date)   SpO2 100%  Gen:   Awake, no distress   Resp:  Normal effort  MSK:   Moves extremities without difficulty  Other:    Medical Decision Making  Medically screening exam initiated at 9:36 PM.  Appropriate orders placed.  [name] was informed that the remainder of the evaluation will be completed by another provider, this initial triage assessment does not replace that evaluation, and the importance of remaining in the ED until their evaluation is complete.     Maxwell Marion, PA-C 11/02/23 2144

## 2023-11-03 DIAGNOSIS — I10 Essential (primary) hypertension: Secondary | ICD-10-CM | POA: Diagnosis not present

## 2023-11-03 NOTE — ED Notes (Signed)
Patient left.

## 2023-11-04 ENCOUNTER — Other Ambulatory Visit: Payer: Self-pay

## 2023-11-04 DIAGNOSIS — N760 Acute vaginitis: Secondary | ICD-10-CM

## 2023-11-04 MED ORDER — METRONIDAZOLE 0.75 % VA GEL
1.0000 | Freq: Every day | VAGINAL | 0 refills | Status: AC
Start: 2023-11-04 — End: 2023-11-09

## 2023-11-05 ENCOUNTER — Encounter: Payer: Self-pay | Admitting: Family Medicine

## 2023-11-08 MED ORDER — LEVONORGESTREL 1.5 MG PO TABS: 1.5000 mg | ORAL_TABLET | Freq: Once | ORAL | 0 refills | Status: AC

## 2023-11-10 ENCOUNTER — Ambulatory Visit (INDEPENDENT_AMBULATORY_CARE_PROVIDER_SITE_OTHER): Payer: BC Managed Care – PPO | Admitting: *Deleted

## 2023-11-10 DIAGNOSIS — J339 Nasal polyp, unspecified: Secondary | ICD-10-CM | POA: Diagnosis not present

## 2023-11-24 ENCOUNTER — Ambulatory Visit: Payer: Medicaid Other

## 2023-12-06 NOTE — Progress Notes (Deleted)
   [address] [place] [address] Dept: [phone]  FOLLOW UP NOTE  Patient ID: Stacey Wyatt, female    DOB: [birth year]  Age: 28 y.o. MRN: [id number] Date of Office Visit: 12/09/2023  Assessment  Chief Complaint: No chief complaint on file.  HPI Stacey Wyatt is a 28 year old female who presents to the clinic for a follow up visit. She was last seen in this clinic on 12/21/2022 by Thermon Leyland, FNP, for evaluation of asthma, allergic rhinitis, atopic dermatitis, and nasal polyposis on Dupixent injections.   Her last environmental allergy testing was via lab on 03/06/2022 and was positive to cat, dog, grass pollen, weed pollen, mold, tree pollen, dust mites, and cockroach.   Discussed the use of AI scribe software for clinical note transcription with the patient, who gave verbal consent to proceed.  History of Present Illness             Drug Allergies:  No Known Allergies  Physical Exam: There were no vitals taken for this visit.   Physical Exam  Diagnostics:    Assessment and Plan: No diagnosis found.  No orders of the defined types were placed in this encounter.   There are no Patient Instructions on file for this visit.  No follow-ups on file.    Thank you for the opportunity to care for this patient.  Please do not hesitate to contact me with questions.  Thermon Leyland, FNP [facility]

## 2023-12-09 ENCOUNTER — Ambulatory Visit: Payer: BC Managed Care – PPO | Admitting: Family Medicine

## 2024-01-19 ENCOUNTER — Ambulatory Visit: Payer: BC Managed Care – PPO | Admitting: Family Medicine

## 2024-05-10 ENCOUNTER — Inpatient Hospital Stay
Admit: 2024-05-10 | Discharge: 2024-05-11 | Disposition: A | Discharge status: Home / Self Care | Arrived: VH | Attending: Student in an Organized Health Care Education/Training Program

## 2024-05-10 DIAGNOSIS — A64 Unspecified sexually transmitted disease: Principal | ICD-10-CM

## 2024-05-10 LAB — PREGNANCY, URINE: Pregnancy, Urine: NEGATIVE

## 2024-05-10 NOTE — Discharge Instructions (Addendum)
 You were seen in the emergency department today at [facility] for discharge and itching.  We did a a vaginal swab which showed a trichomonas as well as BV.  We recommended being treated empirically for chlamydia as well as gonorrhea and we gave you a dose of Rocephin  in the ED and we are discharging you with an antibiotic called doxycycline  which you need to take twice a day for the next 7 days to treat a potential chlamydia infection.  We are also discharging you with metronidazole  please take this twice a day for the next 7 days to treat the BV and trichomoniasis.  Please return to the emergency department if you have a fever, pain with intercourse, or develop nausea or vomiting.  If discharge does not get better please follow-up with one of the names given to you for a primary care physician.

## 2024-05-10 NOTE — ED Provider Notes (Signed)
 [place] [facility] EMERGENCY DEPARTMENT     Emergency Department Attestation    I performed a history and physical examination of the patient and discussed management with the resident. I reviewed the resident's note and agree with the documented findings and plan of care. Any areas of disagreement are noted on the chart. I was personally present for the key portions of any procedures. I have documented in the chart those procedures where I was not present during the key portions. I have reviewed the emergency nurses triage note. I agree with the chief complaint, past medical history, past surgical history, allergies, medications, social and family history as documented unless otherwise noted below. For Physician Assistant/ Nurse Practitioner cases/documentation I have personally evaluated this patient and have completed at least one if not all key elements of the E/M (history, physical exam, and MDM). Additional findings are as noted.      Chief Complaint   Patient presents with    Exposure to STD       INITIAL VITALS:  temperature is 98.2 F (36.8 C). Her blood pressure is 140/102 (abnormal) and her pulse is 67. Her respiration is 18 and oxygen saturation is 100%.       DIAGNOSTIC RESULTS       RADIOLOGY:   Non-plain film images such as CT, Ultrasound and MRI are read by the radiologist. Plain radiographic images are visualized and the radiologist interpretations are reviewed as follows:     No orders to display         LABS:  Results for orders placed or performed during the hospital encounter of 05/10/24   C.trachomatis N.gonorrhoeae DNA    Specimen: Cervix   Result Value Ref Range    Specimen Description .CERVIX     C. trachomatis DNA PENDING     N. gonorrhoeae DNA PENDING    Vaginitis DNA Probe    Specimen: Vaginal   Result Value Ref Range    Source .VAGINAL SWAB     Trichomonas POSITIVE (A) NEGATIVE    GARDNERELLA VAGINALIS POSITIVE (A) NEGATIVE    Candida species NEGATIVE NEGATIVE   Pregnancy, Urine    Result Value Ref Range    Pregnancy, Urine NEGATIVE NEGATIVE       EKG:  None    EMERGENCY DEPARTMENT COURSE:   Vitals:    Vitals:    05/10/24 1720   BP: (!) 140/102   Pulse: 67   Resp: 18   Temp: 98.2 F (36.8 C)   SpO2: 100%     -------------------------  BP: (!) 140/102, Temp: 98.2 F (36.8 C), Pulse: 67, Respirations: 18      Medical Decision Making  Patient a 28 year old female who presents with abnormal vaginal discharge.  Some associated vaginal irritation.  No abnormal bleeding.  States she is sexually active with 1 partner.  Denies having symptoms like this in the past.    Patient appears in no acute distress initial evaluation, afebrile, stable vital signs.  Pelvic exam per resident.  Abdomen soft, nontender.  Swab sent.  Urine pregnancy sent as well.    Urine pregnancy negative.  Vaginitis probe did result for trichomonas as well as bacterial vaginosis.  Offered empiric treatment for chlamydia and gonorrhea as well, patient interested in this.  Patient treated with antibiotics.  Will discharge with close outpatient follow-up, given strict return precautions.    Amount and/or Complexity of Data Reviewed  Labs: ordered. Decision-making details documented in ED Course.    Risk  Prescription  drug management.            ED Course as of 05/11/24 0151   Wed May 10, 2024   2131 Vaginitis DNA Probe [WR]      ED Course User Index  [WR] Okey Elsie Loving, DO         Critical Care:  None      FINAL IMPRESSION      1. STD (female)    2. Bacterial vaginosis          DISPOSITION/PLAN     DISPOSITION Decision To Discharge 05/10/2024 09:56:51 PM   DISPOSITION CONDITION Stable           PATIENT REFERRED TO:  No follow-up provider specified.    DISCHARGE MEDICATIONS:  Discharge Medication List as of 05/10/2024 10:06 PM        START taking these medications    Details   doxycycline  hyclate (VIBRA -TABS) 100 MG tablet Take 1 tablet by mouth 2 times daily for 7 days, Disp-14 tablet, R-0Print      metroNIDAZOLE  (FLAGYL ) 500  MG tablet Take 1 tablet by mouth 2 times daily for 7 days, Disp-14 tablet, R-0Print                (Please note that portions of this note were completed with a voice recognition program.  Efforts were made to edit the dictations but occasionally words are mis-transcribed.)    Lorane CHRISTELLA Daring, DO  Emergency Medicine Physician  05/11/24  1:51 AM        Daring Lorane CHRISTELLA, DO  05/11/24 0151

## 2024-05-10 NOTE — ED Provider Notes (Incomplete)
 [place] [facility] EMERGENCY DEPARTMENT     Emergency Department Attestation    I performed a history and physical examination of the patient and discussed management with the resident. I reviewed the resident's note and agree with the documented findings and plan of care. Any areas of disagreement are noted on the chart. I was personally present for the key portions of any procedures. I have documented in the chart those procedures where I was not present during the key portions. I have reviewed the emergency nurses triage note. I agree with the chief complaint, past medical history, past surgical history, allergies, medications, social and family history as documented unless otherwise noted below. For Physician Assistant/ Nurse Practitioner cases/documentation I have personally evaluated this patient and have completed at least one if not all key elements of the E/M (history, physical exam, and MDM). Additional findings are as noted.    ***    Chief Complaint   Patient presents with   . Exposure to STD       INITIAL VITALS:  temperature is 98.2 F (36.8 C). Her blood pressure is 140/102 (abnormal) and her pulse is 67. Her respiration is 18 and oxygen saturation is 100%.       DIAGNOSTIC RESULTS       RADIOLOGY:   Non-plain film images such as CT, Ultrasound and MRI are read by the radiologist. Plain radiographic images are visualized and the radiologist interpretations are reviewed as follows:     No orders to display         LABS:  Results for orders placed or performed during the hospital encounter of 05/10/24   C.trachomatis N.gonorrhoeae DNA    Specimen: Cervix   Result Value Ref Range    Specimen Description .CERVIX     C. trachomatis DNA PENDING     N. gonorrhoeae DNA PENDING    Vaginitis DNA Probe    Specimen: Vaginal   Result Value Ref Range    Source .VAGINAL SWAB     Trichomonas POSITIVE (A) NEGATIVE    GARDNERELLA VAGINALIS POSITIVE (A) NEGATIVE    Candida species NEGATIVE NEGATIVE   Pregnancy, Urine    Result Value Ref Range    Pregnancy, Urine NEGATIVE NEGATIVE       EKG:  None***    EMERGENCY DEPARTMENT COURSE:   Vitals:    Vitals:    05/10/24 1720   BP: (!) 140/102   Pulse: 67   Resp: 18   Temp: 98.2 F (36.8 C)   SpO2: 100%     -------------------------  BP: (!) 140/102, Temp: 98.2 F (36.8 C), Pulse: 67, Respirations: 18      Medical Decision Making  Patient a 28 year old female who presents with abnormal vaginal discharge.  Some associated vaginal irritation.  No abnormal bleeding.  States she is sexually active with 1 partner.  Denies having symptoms like this in the past.    Patient appears in no acute distress initial evaluation, afebrile, stable vital signs    Amount and/or Complexity of Data Reviewed  Labs: ordered. Decision-making details documented in ED Course.    Risk  Prescription drug management.          ***  ED Course as of 05/10/24 2310   Wed May 10, 2024   2131 Vaginitis DNA Probe [WR]      ED Course User Index  [WR] Okey Elsie Loving, DO         Critical Care:  None***  FINAL IMPRESSION      1. STD (female)    2. Bacterial vaginosis          DISPOSITION/PLAN     DISPOSITION Decision To Discharge 05/10/2024 09:56:51 PM   DISPOSITION CONDITION Stable           PATIENT REFERRED TO:  No follow-up provider specified.    DISCHARGE MEDICATIONS:  Discharge Medication List as of 05/10/2024 10:06 PM        START taking these medications    Details   doxycycline  hyclate (VIBRA -TABS) 100 MG tablet Take 1 tablet by mouth 2 times daily for 7 days, Disp-14 tablet, R-0Print      metroNIDAZOLE  (FLAGYL ) 500 MG tablet Take 1 tablet by mouth 2 times daily for 7 days, Disp-14 tablet, R-0Print                (Please note that portions of this note were completed with a voice recognition program.  Efforts were made to edit the dictations but occasionally words are mis-transcribed.)    Lorane CHRISTELLA Daring, DO  Emergency Medicine Physician  05/10/24  11:10 PM
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
## 2024-05-10 NOTE — ED Notes (Signed)
 Patient provided with PCP list as she is new to the area.

## 2024-05-10 NOTE — ED Provider Notes (Cosign Needed)
 [facility] EMERGENCY DEPARTMENT  Emergency Department Encounter  Emergency Medicine Resident     Pt Name:Andrea Good  MRN: [id number]  Birthdate 04/30/1996  Date of evaluation: 05/10/24  PCP:  No primary care provider on file.  Note Started: 7:05 PM EDT      CHIEF COMPLAINT       Chief Complaint   Patient presents with    Exposure to STD       HISTORY OF PRESENT ILLNESS  (Location/Symptom, Timing/Onset, Context/Setting, Quality, Duration, Modifying Factors, Severity.)      Andrea Good is a 28 y.o. female who presents with concern for an STD.  Patient states she has been with the same partner for 5 months and they use protection.  The most recent time however the condom did slip off for a moment and this is what patients is worried about.  Patient symptoms consist of vaginal itching and a foul smelling discharge.  Patient says she has had BV in the past and that this is not BV.    PAST MEDICAL / SURGICAL / SOCIAL / FAMILY HISTORY      has a past medical history of Asthma and Seasonal allergies.     has a past surgical history that includes Adenoidectomy.      Social History     Socioeconomic History    Marital status: Single     Spouse name: Not on file    Number of children: Not on file    Years of education: Not on file    Highest education level: Not on file   Occupational History    Not on file   Tobacco Use    Smoking status: Never    Smokeless tobacco: Never   Substance and Sexual Activity    Alcohol use: No    Drug use: No    Sexual activity: Yes     Partners: Male   Other Topics Concern    Not on file   Social History Narrative    Not on file     Social Drivers of Health     Financial Resource Strain: Not on file   Food Insecurity: Not on file   Transportation Needs: Not on file   Physical Activity: Not on file   Stress: Not on file   Social Connections: Not on file   Intimate Partner Violence: Not on file   Housing Stability: Not on file       No family history on file.    Allergies:   Patient has no known allergies.    Home Medications:  Prior to Admission medications    Medication Sig Start Date End Date Taking? Authorizing Provider   doxycycline  hyclate (VIBRA -TABS) 100 MG tablet Take 1 tablet by mouth 2 times daily for 7 days 05/10/24 05/17/24 Yes Okey Elsie Loving, DO   metroNIDAZOLE  (FLAGYL ) 500 MG tablet Take 1 tablet by mouth 2 times daily for 7 days 05/10/24 05/17/24 Yes Okey Elsie Loving, DO   albuterol  sulfate HFA (VENTOLIN  HFA) 108 (90 Base) MCG/ACT inhaler Inhale 2 puffs into the lungs every 6 hours as needed for Wheezing 11/11/21   Hilliard, Rankin SAUNDERS, DO   fluticasone  (FLONASE ) 50 MCG/ACT nasal spray 2 sprays by Each Nostril route daily 11/11/21   Butler Rankin SAUNDERS, DO   cetirizine  (ZYRTEC ) 10 MG tablet Take 1 tablet by mouth daily 11/11/21   Butler Rankin SAUNDERS, DO       REVIEW OF SYSTEMS  Review of Systems   Constitutional:  Negative for activity change, appetite change, fatigue, fever and unexpected weight change.   Genitourinary:  Positive for vaginal discharge. Negative for decreased urine volume, difficulty urinating, dyspareunia, dysuria, enuresis, flank pain, frequency, genital sores, hematuria, menstrual problem, pelvic pain, urgency, vaginal bleeding and vaginal pain.   Skin:  Negative for rash.       PHYSICAL EXAM      INITIAL VITALS:   BP (!) 140/102   Pulse 67   Temp 98.2 F (36.8 C)   Resp 18   SpO2 100%     Physical Exam  General Appearance: On entering the room the patient is sitting comfortably in bed in no acute distress. AxOx4.  HEENT: Normal cephalic atraumatic, moist mucus membranes. EOMI, clear conjunctiva, oropharynx clear.  NECK Supple w/o lymphadenopathy. No stiffness or restricted ROM.  HEART: Normal rate and regular rhythm, normal s1/s2, no murmurs rubs or gallops  LUNGS: CTAB, moving air well. No crackles or wheezes are heard.  ABDOMEN: Soft, non-tender, non-distended with good bowel sounds heard.   BACK: No CVAT, no obvious deformity  EXTREMITIES: W/o  cyanosis, clubbing or edema  NEUROLOGICAL: Grossly nonfocal. Alert and oriented, moving all 4 extremities. EOMI, PERRL, CNV and VII intact. Tongue protrudes midline, and uvula rises symmetrically.  SKIN: Warm and dry without any rash.      DDX/DIAGNOSTIC RESULTS / EMERGENCY DEPARTMENT COURSE / MDM     Medical Decision Making  Amount and/or Complexity of Data Reviewed  Labs: ordered. Decision-making details documented in ED Course.    Risk  Prescription drug management.      Andrea Good patient is presenting today for STD check.  That she had an exposure.  There urinalysis to test for pregnancy and did a swab for vaginitis as well as chlamydia and gonorrhea.  Patient was positive for BV and trichomoniasis.  She wanted empiric treatment for gonorrhea and chlamydia treated with 500 mg Rocephin  and 100 mg twice daily of doxycycline  and 500 mg metronidazole  twice daily for 7 days.  Patient comfortable for discharge and having the diagnosis.    EMERGENCY DEPARTMENT COURSE:    ED Course as of 05/11/24 0336   Wed May 10, 2024   2131 Vaginitis DNA Probe [WR]   Thu May 11, 2024   0329 Vaginitis DNA Probe(!):    Source .VAGINAL SWAB   Trichomonas POSITIVE(!)   GARDNERELLA VAGINALIS POSITIVE(!)   Candida Species NEGATIVE [WR]   0329 C.trachomatis N.gonorrhoeae DNA:    Specimen Description .CERVIX   C. trachomatis DNA PENDING   N. gonorrhoeae DNA PENDING [WR]   0329 Pregnancy, Urine:    Pregnancy, Urine NEGATIVE [WR]      ED Course User Index  [WR] Okey Elsie Loving, DO       PROCEDURES:  Pelvic exam  Pelvic Exam  Prior to beginning the pelvic examination the patient consented to the procedure.  Patient was put in the frog position with a towel under her lower back.  External genitals were examined and there were no lesions, normal appearing vulva.  Labia majora with spread vagina introitus was examined, and no discharge was noted no lesions no discoloration.  Speculum was inserted and the patient had minor pain/ discomfort  with insertion.  No lesions were noted in the vaginal canal abnormal discharge pooling in the posterior fornix.  Cervix was visualized.  2 swabs were then utilized to take samples from the cervix for chlamydia and gonorrhea and vaginitis.  The speculum  was then removed.  During the bimanual exam the patient had minor discomfort with insertion and had bilateral adnexal tenderness.  No masses were felt.  Anterior and posterior fornix of the cervix were palpated without any tenderness.        CONSULTS:  None    CRITICAL CARE:  There was significant risk of life threatening deterioration of patient's condition requiring my direct management. Critical care time 0 minutes, excluding any documented procedures.    FINAL IMPRESSION      1. STD (female)    2. Bacterial vaginosis          DISPOSITION / PLAN     DISPOSITION Decision To Discharge 05/10/2024 09:56:51 PM   DISPOSITION CONDITION Stable       PATIENT REFERRED TO:  No follow-up provider specified.    DISCHARGE MEDICATIONS:  Discharge Medication List as of 05/10/2024 10:06 PM        START taking these medications    Details   doxycycline  hyclate (VIBRA -TABS) 100 MG tablet Take 1 tablet by mouth 2 times daily for 7 days, Disp-14 tablet, R-0Print      metroNIDAZOLE  (FLAGYL ) 500 MG tablet Take 1 tablet by mouth 2 times daily for 7 days, Disp-14 tablet, R-0Print             Elsie Dallas Gull, DO  Emergency Medicine Resident    (Please note that portions of this note were completed with a voice recognition program.  Efforts were made to edit the dictations but occasionally words are mis-transcribed.)

## 2024-05-11 LAB — C.TRACHOMATIS N.GONORRHOEAE DNA
C. trachomatis DNA: NEGATIVE
N. gonorrhoeae DNA: NEGATIVE

## 2024-05-11 LAB — VAGINITIS DNA PROBE
Candida species: NEGATIVE
GARDNERELLA VAGINALIS: POSITIVE — AB
Trichomonas: POSITIVE — AB

## 2024-05-11 MED ORDER — CEFTRIAXONE SODIUM 500 MG IJ SOLR
500 | Freq: Once | INTRAMUSCULAR | Status: AC
Start: 2024-05-11 — End: 2024-05-10
  Administered 2024-05-11: 02:00:00 500 mg via INTRAMUSCULAR

## 2024-05-11 MED ORDER — DOXYCYCLINE HYCLATE 100 MG PO TABS
100 | Freq: Once | ORAL | Status: AC
Start: 2024-05-11 — End: 2024-05-10
  Administered 2024-05-11: 02:00:00 100 mg via ORAL

## 2024-05-11 MED ORDER — METRONIDAZOLE 500 MG PO TABS
500 | Freq: Once | ORAL | Status: AC
Start: 2024-05-11 — End: 2024-05-10
  Administered 2024-05-11: 02:00:00 500 mg via ORAL

## 2024-05-11 MED ORDER — DOXYCYCLINE HYCLATE 100 MG PO TABS
100 | ORAL_TABLET | Freq: Two times a day (BID) | ORAL | 0 refills | 7.00000 days | Status: AC
Start: 2024-05-11 — End: 2024-05-17

## 2024-05-11 MED ORDER — METRONIDAZOLE 500 MG PO TABS
500 | ORAL_TABLET | Freq: Two times a day (BID) | ORAL | 0 refills | 7.00000 days | Status: AC
Start: 2024-05-11 — End: 2024-05-17

## 2024-05-11 MED FILL — CEFTRIAXONE SODIUM 500 MG IJ SOLR: 500 mg | INTRAMUSCULAR | Qty: 500 | Fill #0

## 2024-05-11 MED FILL — DOXYCYCLINE HYCLATE 100 MG PO TABS: 100 mg | ORAL | Qty: 1 | Fill #0

## 2024-05-11 MED FILL — METRONIDAZOLE 500 MG PO TABS: 500 mg | ORAL | Qty: 1 | Fill #0
